# Patient Record
Sex: Female | Born: 1950 | Race: White | Hispanic: No | Marital: Single | Smoking: Never smoker
Health system: Southern US, Community
[De-identification: ages and names within clinical notes are randomized; demographics above are authoritative.]

## PROBLEM LIST (undated history)

## (undated) DIAGNOSIS — F32A Depression, unspecified: Secondary | ICD-10-CM

## (undated) DIAGNOSIS — C44529 Squamous cell carcinoma of skin of other part of trunk: Secondary | ICD-10-CM

## (undated) DIAGNOSIS — K633 Ulcer of intestine: Secondary | ICD-10-CM

## (undated) DIAGNOSIS — G8929 Other chronic pain: Secondary | ICD-10-CM

## (undated) DIAGNOSIS — M1990 Unspecified osteoarthritis, unspecified site: Secondary | ICD-10-CM

## (undated) DIAGNOSIS — F329 Major depressive disorder, single episode, unspecified: Secondary | ICD-10-CM

## (undated) DIAGNOSIS — D0461 Carcinoma in situ of skin of right upper limb, including shoulder: Secondary | ICD-10-CM

## (undated) DIAGNOSIS — K921 Melena: Secondary | ICD-10-CM

## (undated) HISTORY — DX: Melena: K92.1

## (undated) HISTORY — DX: Depression, unspecified: F32.A

## (undated) HISTORY — DX: Squamous cell carcinoma of skin of other part of trunk: C44.529

## (undated) HISTORY — DX: Other chronic pain: G89.29

## (undated) HISTORY — PX: BACK SURGERY: SHX140

## (undated) HISTORY — PX: HAND SURGERY: SHX662

## (undated) HISTORY — PX: EYE SURGERY: SHX253

## (undated) HISTORY — PX: SPINAL FUSION: SHX223

## (undated) HISTORY — PX: JOINT REPLACEMENT: SHX530

## (undated) HISTORY — DX: Ulcer of intestine: K63.3

## (undated) HISTORY — PX: BREAST CYST ASPIRATION: SHX578

## (undated) HISTORY — DX: Carcinoma in situ of skin of right upper limb, including shoulder: D04.61

## (undated) HISTORY — PX: FRACTURE SURGERY: SHX138

## (undated) HISTORY — DX: Unspecified osteoarthritis, unspecified site: M19.90

---
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
## 2015-01-14 LAB — HM COLONOSCOPY

## 2018-01-23 ENCOUNTER — Encounter: Payer: Self-pay | Admitting: Family Medicine

## 2019-07-09 ENCOUNTER — Encounter: Payer: Self-pay | Admitting: Primary Care

## 2019-07-09 ENCOUNTER — Ambulatory Visit (INDEPENDENT_AMBULATORY_CARE_PROVIDER_SITE_OTHER): Payer: Medicare Other | Admitting: Primary Care

## 2019-07-09 ENCOUNTER — Other Ambulatory Visit: Payer: Self-pay

## 2019-07-09 VITALS — BP 140/84 | HR 66 | Temp 96.9°F | Ht 64.0 in | Wt 130.0 lb

## 2019-07-09 DIAGNOSIS — M549 Dorsalgia, unspecified: Secondary | ICD-10-CM | POA: Insufficient documentation

## 2019-07-09 DIAGNOSIS — M545 Low back pain: Secondary | ICD-10-CM

## 2019-07-09 DIAGNOSIS — G8929 Other chronic pain: Secondary | ICD-10-CM | POA: Insufficient documentation

## 2019-07-09 DIAGNOSIS — K625 Hemorrhage of anus and rectum: Secondary | ICD-10-CM | POA: Insufficient documentation

## 2019-07-09 DIAGNOSIS — F4323 Adjustment disorder with mixed anxiety and depressed mood: Secondary | ICD-10-CM | POA: Insufficient documentation

## 2019-07-09 DIAGNOSIS — G479 Sleep disorder, unspecified: Secondary | ICD-10-CM | POA: Diagnosis not present

## 2019-07-09 DIAGNOSIS — Z1231 Encounter for screening mammogram for malignant neoplasm of breast: Secondary | ICD-10-CM

## 2019-07-09 HISTORY — DX: Hemorrhage of anus and rectum: K62.5

## 2019-07-09 LAB — COMPREHENSIVE METABOLIC PANEL
ALT: 15 U/L (ref 0–35)
AST: 19 U/L (ref 0–37)
Albumin: 4.7 g/dL (ref 3.5–5.2)
Alkaline Phosphatase: 82 U/L (ref 39–117)
BUN: 9 mg/dL (ref 6–23)
CO2: 31 mEq/L (ref 19–32)
Calcium: 10.4 mg/dL (ref 8.4–10.5)
Chloride: 100 mEq/L (ref 96–112)
Creatinine, Ser: 0.77 mg/dL (ref 0.40–1.20)
GFR: 74.12 mL/min (ref >60.00)
Glucose, Bld: 102 mg/dL — ABNORMAL HIGH (ref 70–99)
Potassium: 4.7 mEq/L (ref 3.5–5.1)
Sodium: 136 mEq/L (ref 135–145)
Total Bilirubin: 0.5 mg/dL (ref 0.2–1.2)
Total Protein: 6.9 g/dL (ref 6.0–8.3)

## 2019-07-09 LAB — CBC
HCT: 39.2 % (ref 36.0–46.0)
Hemoglobin: 13.4 g/dL (ref 12.0–15.0)
MCHC: 34.1 g/dL (ref 30.0–36.0)
MCV: 92.4 fl (ref 78.0–100.0)
Platelets: 292 10*3/uL (ref 150.0–400.0)
RBC: 4.25 Mil/uL (ref 3.87–5.11)
RDW: 15 % (ref 11.5–15.5)
WBC: 5.1 10*3/uL (ref 4.0–10.5)

## 2019-07-09 MED ORDER — MIRTAZAPINE 7.5 MG PO TABS: 7.5000 mg | ORAL_TABLET | Freq: Every day | ORAL | 0 refills | Status: DC

## 2019-07-09 NOTE — Assessment & Plan Note (Signed)
Following with Neurology in [facility] who manages Lithium, also following with therapy in [place].  Agree to add in low dose Remeron for sleep as she did well in the past.

## 2019-07-09 NOTE — Patient Instructions (Addendum)
Stop by the lab prior to leaving today. I will notify you of your results once received.   Call the [facility] to schedule your mammogram.   You will be contacted regarding your referral to GI for the colonoscopy.  Please let us know if you have not been contacted within two weeks.   Start mirtazapine (Remeron) 7.5 mg at bedtime for sleep. You can take 1-2 capsules.   It was a pleasure to meet you today! Please don't hesitate to call or message me with any questions. Welcome to [facility]!

## 2019-07-09 NOTE — Assessment & Plan Note (Signed)
Acute for the last three weeks, etiology seems to be lower GI tract. History of hemorrhoid.  Referral placed to GI for evaluation.

## 2019-07-09 NOTE — Assessment & Plan Note (Signed)
History of three back surgeries, doing better on gabapentin 300 mg TID. Continue same.

## 2019-07-09 NOTE — Assessment & Plan Note (Signed)
Situational as she is going through a divorce. Agree to prescribe Remeron as she did well on this in the past. Following with Neurology through [facility] who manages her Lithium.

## 2019-07-09 NOTE — Progress Notes (Signed)
Subjective:    Patient ID: Bonnie Ramsey, female    DOB: [birth year], 69 y.o.   MRN: [id number]  HPI  This visit occurred during the SARS-CoV-2 public health emergency.  Safety protocols were in place, including screening questions prior to the visit, additional usage of staff PPE, and extensive cleaning of exam room while observing appropriate contact time as indicated for disinfecting solutions.   [name] is a 69 year old female who presents today to establish care and discuss the problems mentioned below. Will obtain/review records. She follows through the [place] for her annual physical. She is needing a mammogram.   1) Anxiety/Depression: Currently managed on Lithium 300 mg. Following with Dr. Tamala Julian, neurology [facility]) who is prescribing Litium. She is going through a divorce now which has been challenging.   She was once managed on Remeron HS for sleep, did very well on this medication and would like to resume. She took Remeron along with Lithium in the past. She has difficulty staying asleep, will wake up with mind racing thoughts.   Currently following with therapist in [place].   2) Blood in Stool: She initially noticed this three weeks ago, bright red bleeding and mucous that occurred with bowel movements and then in between movements. She bleed for a total of six days, had bleeding in her underwear. Since then she's noticed a gradual improvement in bleeding frequency and heaviness. She has been consuming prune juice which has helped with constipation.   History of precancerous polyps, last colonoscopy was in 2016 and was told to return for recall in 2026. She believes she has an internal hemorrhoid. She is under a lot of stress with her current divorce.   3) Chronic Back Pain: History of three back surgeries beginning in 2015. Currently managed on gabapentin 300 mg TID, managed through the [place] in [place], [facility]     BP Readings from Last 3  Encounters:  07/09/19 140/84     Review of Systems  Eyes: Negative for visual disturbance.  Respiratory: Negative for shortness of breath.   Cardiovascular: Negative for chest pain.  Gastrointestinal: Positive for blood in stool and constipation. Negative for abdominal pain.  Musculoskeletal: Positive for back pain.  Neurological: Negative for dizziness and headaches.  Psychiatric/Behavioral: Positive for sleep disturbance. The patient is nervous/anxious.        See HPI       Past Medical History:  Diagnosis Date  . Blood in stool   . Chronic back pain   . Depression      Social History   Socioeconomic History  . Marital status: Legally Separated    Spouse name: Not on file  . Number of children: Not on file  . Years of education: Not on file  . Highest education level: Not on file  Occupational History  . Not on file  Tobacco Use  . Smoking status: Never Smoker  . Smokeless tobacco: Never Used  Substance and Sexual Activity  . Alcohol use: Yes  . Drug use: Not on file  . Sexual activity: Not on file  Other Topics Concern  . Not on file  Social History Narrative  . Not on file   Social Determinants of Health   Financial Resource Strain:   . Difficulty of Paying Living Expenses:   Food Insecurity:   . Worried About Charity fundraiser in the Last Year:   . Stewart in the Last Year:  Transportation Needs:   . Film/video editor (Medical):   Marland Kitchen Lack of Transportation (Non-Medical):   Physical Activity:   . Days of Exercise per Week:   . Minutes of Exercise per Session:   Stress:   . Feeling of Stress :   Social Connections:   . Frequency of Communication with Friends and Family:   . Frequency of Social Gatherings with Friends and Family:   . Attends Religious Services:   . Active Member of Clubs or Organizations:   . Attends Archivist Meetings:   Marland Kitchen Marital Status:   Intimate Partner Violence:   . Fear of Current or Ex-Partner:     . Emotionally Abused:   Marland Kitchen Physically Abused:   . Sexually Abused:      No family history on file.  No Known Allergies  Current Outpatient Medications on File Prior to Visit  Medication Sig Dispense Refill  . b complex vitamins tablet Take 1 tablet by mouth daily.    Marland Kitchen gabapentin (NEURONTIN) 300 MG capsule Take 300 mg by mouth 3 (three) times daily.    Marland Kitchen lithium 300 MG tablet Take 300 mg by mouth at bedtime.    Marland Kitchen VITAMIN D PO Take by mouth.     No current facility-administered medications on file prior to visit.    BP 140/84   Pulse 66   Temp (!) 96.9 F (36.1 C) (Temporal)   Ht 5\' 4"  (1.626 m)   Wt 130 lb (59 kg)   SpO2 98%   BMI 22.31 kg/m    Objective:   Physical Exam  Constitutional: She appears well-nourished.  Cardiovascular: Normal rate and regular rhythm.  Respiratory: Effort normal and breath sounds normal.  Musculoskeletal:     Cervical back: Neck supple.  Skin: Skin is warm and dry.  Psychiatric:  Appears anxious           Assessment & Plan:

## 2019-07-10 ENCOUNTER — Encounter: Payer: Self-pay | Admitting: Primary Care

## 2019-07-14 ENCOUNTER — Encounter: Payer: Self-pay | Admitting: Primary Care

## 2019-07-14 ENCOUNTER — Encounter: Payer: Self-pay | Admitting: Gastroenterology

## 2019-07-14 ENCOUNTER — Ambulatory Visit (INDEPENDENT_AMBULATORY_CARE_PROVIDER_SITE_OTHER): Payer: Medicare Other | Admitting: Gastroenterology

## 2019-07-14 DIAGNOSIS — K625 Hemorrhage of anus and rectum: Secondary | ICD-10-CM | POA: Diagnosis not present

## 2019-07-14 MED ORDER — HYDROCORTISONE (PERIANAL) 2.5 % EX CREA: 1.0000 "application " | TOPICAL_CREAM | Freq: Two times a day (BID) | CUTANEOUS | 0 refills | Status: AC

## 2019-07-14 NOTE — Progress Notes (Signed)
Bonnie Ramsey [address]  [place]  [place], [place] [address]  Main: [phone]  Fax: [phone]   Gastroenterology Consultation  Referring Provider:     Pleas Koch, NP Primary Care Physician:  Pleas Koch, NP Reason for Consultation:     Blood per rectum        HPI:   Virtual Visit via Video Note  I connected with patient on 07/14/19 at  1:30 PM EDT by video ([email or web address]) and verified that I am speaking with the correct person using two identifiers.   I discussed the limitations, risks, security and privacy concerns of performing an evaluation and management service by video and the availability of in person appointments. I also discussed with the patient that there may be a patient responsible charge related to this service. The patient expressed understanding and agreed to proceed.  Location of the patient: Home Location of provider: Home Participating persons: Patient and provider only (Nursing staff checked in patient via phone but were not physically involved in the video interaction - see their notes)   History of Present Illness: Chief Complaint  Patient presents with  . New Patient (Initial Visit)    rectal bleeding    Bonnie Ramsey is a 69 y.o. y/o female referred for consultation & management  by Dr. Carlis Favata, Leticia Penna, NP.  Patient reports seeing bright red blood per rectum about 6 weeks ago.  She states it was a lot and look like a..  She has since had blood work done, which was done only 5 days ago and did not show any anemia.  She does report history of a large hemorrhoid and states at one point she was told that it may need to be removed.  Reports history of a colonoscopy in 2005 or 2006 and precancerous polyp was removed.  This was done out of town and we do not have the records.  No immediate family members with colon cancer.  Since this episode reports having mucus with bowel movements and intermittently having small blood streaks  in the stool with that.  However, the amount of blood that she saw 6 weeks ago has not reoccurred.  No nausea or vomiting or dysphagia.  No weight loss.  Reports having soft bowel movement every day.  Has not tried topical treatments for hemorrhoids  Past Medical History:  Diagnosis Date  . Blood in stool   . Chronic back pain   . Depression     Past Surgical History:  Procedure Laterality Date  . SPINAL FUSION  2015, 2016, 2017   Lumbar    Prior to Admission medications   Medication Sig Start Date End Date Taking? Authorizing Provider  b complex vitamins tablet Take 1 tablet by mouth daily.   Yes [provider]  gabapentin (NEURONTIN) 300 MG capsule Take 300 mg by mouth 3 (three) times daily.   Yes [provider]  lithium 300 MG tablet Take 300 mg by mouth at bedtime.   Yes [provider]  mirtazapine (REMERON) 7.5 MG tablet Take 1-2 tablets (7.5-15 mg total) by mouth at bedtime. As needed for sleep. 07/09/19  Yes Pleas Koch, NP  VITAMIN D PO Take by mouth.   Yes [provider]  hydrocortisone (ANUSOL-HC) 2.5 % rectal cream Place 1 application rectally 2 (two) times daily for 10 days. 07/14/19 07/24/19  Virgel Manifold, MD    History reviewed. No pertinent family history.   Social History  Tobacco Use  . Smoking status: Never Smoker  . Smokeless tobacco: Never Used  Substance Use Topics  . Alcohol use: Yes  . Drug use: Not on file    Allergies as of 07/14/2019  . (No Known Allergies)    Review of Systems:    All systems reviewed and negative except where noted in HPI.   Observations/Objective:  Labs: CBC    Component Value Date/Time   WBC 5.1 07/09/2019 0942   RBC 4.25 07/09/2019 0942   HGB 13.4 07/09/2019 0942   HCT 39.2 07/09/2019 0942   PLT 292.0 07/09/2019 0942   MCV 92.4 07/09/2019 0942   MCHC 34.1 07/09/2019 0942   RDW 15.0 07/09/2019 0942   CMP     Component Value Date/Time   NA 136 07/09/2019  0942   K 4.7 07/09/2019 0942   CL 100 07/09/2019 0942   CO2 31 07/09/2019 0942   GLUCOSE 102 (H) 07/09/2019 0942   BUN 9 07/09/2019 0942   CREATININE 0.77 07/09/2019 0942   CALCIUM 10.4 07/09/2019 0942   PROT 6.9 07/09/2019 0942   ALBUMIN 4.7 07/09/2019 0942   AST 19 07/09/2019 0942   ALT 15 07/09/2019 0942   ALKPHOS 82 07/09/2019 0942   BILITOT 0.5 07/09/2019 0942    Imaging Studies: No results found.  Assessment and Plan:   Bonnie Ramsey is a 69 y.o. y/o female has been referred for blood per rectum  Assessment and Plan: Clinical history and labs consistent with likely hemorrhoids causing blood per rectum  We will prescribe topical treatment for hemorrhoids and schedule for colonoscopy to rule out any underlying lesions and malignancy  Evaluate hemorrhoids at the time of the colonoscopy to see if she would benefit from surgical referral or banding  Continue high-fiber diet  If symptoms do not improve with topical treatment or if the amount of blood per rectum increases, or she has similar episode to what she had 6 weeks ago she was asked to notify us immediately and she verbalized understanding  Follow Up Instructions:   I discussed the assessment and treatment plan with the patient. The patient was provided an opportunity to ask questions and all were answered. The patient agreed with the plan and demonstrated an understanding of the instructions.   The patient was advised to call back or seek an in-person evaluation if the symptoms worsen or if the condition fails to improve as anticipated.  I provided 15 minutes of face-to-face time via video software during this encounter.  Additional time was spent in reviewing patient's chart, placing orders etc.   Virgel Manifold, MD  Speech recognition software was used to dictate the above note.

## 2019-07-14 NOTE — Patient Instructions (Signed)

## 2019-07-20 ENCOUNTER — Other Ambulatory Visit: Payer: Self-pay

## 2019-07-20 DIAGNOSIS — K625 Hemorrhage of anus and rectum: Secondary | ICD-10-CM

## 2019-07-20 MED ORDER — NA SULFATE-K SULFATE-MG SULF 17.5-3.13-1.6 GM/177ML PO SOLN: 354.0000 mL | Freq: Once | ORAL | 0 refills | Status: AC

## 2019-08-03 ENCOUNTER — Ambulatory Visit
Admission: RE | Admit: 2019-08-03 | Discharge: 2019-08-03 | Disposition: A | Discharge status: Home / Self Care | Payer: Medicare Other | Source: Ambulatory Visit | Attending: Primary Care | Admitting: Primary Care

## 2019-08-03 DIAGNOSIS — Z1231 Encounter for screening mammogram for malignant neoplasm of breast: Secondary | ICD-10-CM | POA: Diagnosis not present

## 2019-08-20 ENCOUNTER — Telehealth: Payer: Self-pay | Admitting: Gastroenterology

## 2019-08-20 ENCOUNTER — Telehealth: Payer: Self-pay

## 2019-08-20 ENCOUNTER — Other Ambulatory Visit: Payer: Medicare Other

## 2019-08-20 NOTE — Telephone Encounter (Signed)
Bonnie Ramsey,  Just received a call from [name].  She states that patient said her colonoscopy was supposed to be on the 19th.  Because she rescheduled it, however it still indicates she is on for 08/24/19.  There were no phone notes indicating a date change.  She is currently in [place], and unable to have her COVID testing prior to her currently scheduled 08/24/19.  She would like a call back to discuss and possibly reschedule.  [phone]  Thanks,  [name], CMA

## 2019-08-20 NOTE — Telephone Encounter (Signed)
Patient is calling and has some questions and is asking if the nurse could give her a call. Please call patient and advise.

## 2019-08-21 ENCOUNTER — Other Ambulatory Visit: Payer: Self-pay

## 2019-08-21 DIAGNOSIS — K625 Hemorrhage of anus and rectum: Secondary | ICD-10-CM

## 2019-08-21 NOTE — Telephone Encounter (Signed)
Called patient to let her know that I would be able to cancel her colonoscopy and reschedule it for Sep 03, 2019. Patient agreed and had no further questions. I will mail patient new information so she could read it over again with a new prescription to make sure that she gets her prep kit. Trish from endo unit was informed.

## 2019-08-24 ENCOUNTER — Ambulatory Visit: Admission: RE | Admit: 2019-08-24 | Payer: Medicare Other | Source: Home / Self Care | Admitting: Gastroenterology

## 2019-08-24 ENCOUNTER — Encounter: Admission: RE | Payer: Self-pay | Source: Home / Self Care

## 2019-08-24 SURGERY — COLONOSCOPY WITH PROPOFOL
Anesthesia: General

## 2019-09-01 ENCOUNTER — Other Ambulatory Visit
Admission: RE | Admit: 2019-09-01 | Discharge: 2019-09-01 | Disposition: A | Discharge status: Home / Self Care | Payer: Medicare Other | Source: Ambulatory Visit | Attending: Gastroenterology | Admitting: Gastroenterology

## 2019-09-01 DIAGNOSIS — Z20822 Contact with and (suspected) exposure to covid-19: Secondary | ICD-10-CM | POA: Insufficient documentation

## 2019-09-01 DIAGNOSIS — Z01812 Encounter for preprocedural laboratory examination: Secondary | ICD-10-CM | POA: Insufficient documentation

## 2019-09-01 LAB — SARS CORONAVIRUS 2 (TAT 6-24 HRS): SARS Coronavirus 2: NEGATIVE

## 2019-09-03 ENCOUNTER — Encounter
Admission: RE | Disposition: A | Discharge status: Home / Self Care | Payer: Self-pay | Source: Home / Self Care | Attending: Gastroenterology

## 2019-09-03 ENCOUNTER — Encounter: Payer: Self-pay | Admitting: Gastroenterology

## 2019-09-03 ENCOUNTER — Ambulatory Visit: Payer: Medicare Other | Admitting: Anesthesiology

## 2019-09-03 ENCOUNTER — Ambulatory Visit
Admission: RE | Admit: 2019-09-03 | Discharge: 2019-09-03 | Disposition: A | Discharge status: Home / Self Care | Payer: Medicare Other | Attending: Gastroenterology | Admitting: Gastroenterology

## 2019-09-03 ENCOUNTER — Other Ambulatory Visit: Payer: Self-pay

## 2019-09-03 DIAGNOSIS — K633 Ulcer of intestine: Secondary | ICD-10-CM

## 2019-09-03 DIAGNOSIS — K625 Hemorrhage of anus and rectum: Secondary | ICD-10-CM | POA: Insufficient documentation

## 2019-09-03 DIAGNOSIS — K648 Other hemorrhoids: Secondary | ICD-10-CM | POA: Insufficient documentation

## 2019-09-03 DIAGNOSIS — Z1211 Encounter for screening for malignant neoplasm of colon: Secondary | ICD-10-CM | POA: Diagnosis not present

## 2019-09-03 DIAGNOSIS — Z79899 Other long term (current) drug therapy: Secondary | ICD-10-CM | POA: Insufficient documentation

## 2019-09-03 DIAGNOSIS — K5730 Diverticulosis of large intestine without perforation or abscess without bleeding: Secondary | ICD-10-CM | POA: Diagnosis not present

## 2019-09-03 DIAGNOSIS — F329 Major depressive disorder, single episode, unspecified: Secondary | ICD-10-CM | POA: Diagnosis not present

## 2019-09-03 HISTORY — PX: COLONOSCOPY WITH PROPOFOL: SHX5780

## 2019-09-03 SURGERY — COLONOSCOPY WITH PROPOFOL
Anesthesia: General

## 2019-09-03 MED ORDER — LIDOCAINE HCL (CARDIAC) PF 100 MG/5ML IV SOSY
PREFILLED_SYRINGE | INTRAVENOUS | Status: DC | PRN
  Administered 2019-09-03: 50 mg via INTRAVENOUS

## 2019-09-03 MED ORDER — SODIUM CHLORIDE 0.9 % IV SOLN: INTRAVENOUS | Status: DC

## 2019-09-03 MED ORDER — PHENYLEPHRINE HCL (PRESSORS) 10 MG/ML IV SOLN
INTRAVENOUS | Status: DC | PRN
  Administered 2019-09-03: 75 ug via INTRAVENOUS
  Administered 2019-09-03 (×2): 100 ug via INTRAVENOUS

## 2019-09-03 MED ORDER — PROPOFOL 500 MG/50ML IV EMUL
INTRAVENOUS | Status: AC
  Filled 2019-09-03: qty 50

## 2019-09-03 MED ORDER — PROPOFOL 500 MG/50ML IV EMUL
INTRAVENOUS | Status: DC | PRN
  Administered 2019-09-03: 150 ug/kg/min via INTRAVENOUS

## 2019-09-03 MED ORDER — PROPOFOL 10 MG/ML IV BOLUS
INTRAVENOUS | Status: DC | PRN
  Administered 2019-09-03: 70 mg via INTRAVENOUS

## 2019-09-03 NOTE — Op Note (Signed)
[facility] Gastroenterology Patient Name: Bonnie Ramsey Procedure Date: 09/03/2019 9:36 AM MRN: [id number] Account #: [id number] Date of Birth: 1950-08-01 Admit Type: Outpatient Age: 69 Room: [facility] ENDO ROOM 4 Gender: Female Note Status: Finalized Procedure:             Colonoscopy Indications:           Screening for colorectal malignant neoplasm Providers:             Jael Waldorf B. Bonna Gains MD, MD Medicines:             Monitored Anesthesia Care Complications:         No immediate complications. Procedure:             Pre-Anesthesia Assessment:                        - Prior to the procedure, a History and Physical was                         performed, and patient medications, allergies and                         sensitivities were reviewed. The patient's tolerance                         of previous anesthesia was reviewed.                        - The risks and benefits of the procedure and the                         sedation options and risks were discussed with the                         patient. All questions were answered and informed                         consent was obtained.                        - Patient identification and proposed procedure were                         verified prior to the procedure by the physician, the                         nurse, the anesthetist and the technician. The                         procedure was verified in the pre-procedure area in                         the procedure room in the endoscopy suite.                        - ASA Grade Assessment: II - A patient with mild                         systemic disease.                        -  After reviewing the risks and benefits, the patient                         was deemed in satisfactory condition to undergo the                         procedure.                        After obtaining informed consent, the colonoscope was                         passed under  direct vision. Throughout the procedure,                         the patient's blood pressure, pulse, and oxygen                         saturations were monitored continuously. The                         Colonoscope was introduced through the anus and                         advanced to the the cecum, identified by appendiceal                         orifice and ileocecal valve. The colonoscopy was                         performed with ease. The patient tolerated the                         procedure well. The quality of the bowel preparation                         was poor. Findings:      The perianal and digital rectal examinations were normal.      Nonbleeding ulcerated mucosa with no stigmata of recent bleeding were       present in the sigmoid colon. Biopsies were taken with a cold forceps       for histology. Biopsies were obtained in the rectum and in the sigmoid       colon with cold forceps for histology. Biopsies were taken at the ulcer       site and of the normal mucosa in the sigmoid colon and rectum as well.       The rest of the colon did not show any ulceration or inflammation and       this was the only solitary ulcer present.      Multiple diverticula were found in the sigmoid colon.      The exam was otherwise without abnormality.      Non-bleeding internal hemorrhoids were found during retroflexion. The       hemorrhoids were small. Impression:            - Preparation of the colon was poor.                        - Mucosal ulceration. Biopsied.                        -  Patient's BRBPR is from the sigmoid ulcer                        - Diverticulosis in the sigmoid colon.                        - The examination was otherwise normal.                        - Non-bleeding internal hemorrhoids.                        - Biopsies were obtained in the rectum and in the                         sigmoid colon. Recommendation:        - Discharge patient to home.                         - Resume previous diet.                        - Continue present medications.                        - Repeat colonoscopy within 3 months, wtih 2 day prep,                         because the bowel preparation was poor.                        - Return to primary care physician as previously                         scheduled.                        - The findings and recommendations were discussed with                         the patient.                        - The findings and recommendations were discussed with                         the patient's family.                        - High fiber diet.                        - High fiber diet.                        - Return to my office in 2 weeks. Procedure Code(s):     --- Professional ---                        [phone], Colonoscopy, flexible; with biopsy, single or  multiple Diagnosis Code(s):     --- Professional ---                        Z12.11, Encounter for screening for malignant neoplasm                         of colon                        K64.8, Other hemorrhoids                        K63.3, Ulcer of intestine CPT copyright 2019 American Medical Association. All rights reserved. The codes documented in this report are preliminary and upon coder review may  be revised to meet current compliance requirements.  Vonda Antigua, MD Margretta Sidle B. Bonna Gains MD, MD 09/03/2019 10:44:29 AM This report has been signed electronically. Number of Addenda: 0 Note Initiated On: 09/03/2019 9:36 AM Scope Withdrawal Time: 0 hours 15 minutes 21 seconds  Total Procedure Duration: 0 hours 26 minutes 53 seconds  Estimated Blood Loss:  Estimated blood loss: none.      [facility]

## 2019-09-03 NOTE — Anesthesia Preprocedure Evaluation (Addendum)
Anesthesia Evaluation  Patient identified by MRN, date of birth, ID band Patient awake    Reviewed: Allergy & Precautions, H&P , NPO status , reviewed documented beta blocker date and time   Airway Mallampati: II  TM Distance: >3 FB Neck ROM: full    Dental  (+) Chipped, Caps   Pulmonary    Pulmonary exam normal        Cardiovascular Normal cardiovascular exam     Neuro/Psych PSYCHIATRIC DISORDERS Depression    GI/Hepatic   Endo/Other    Renal/GU      Musculoskeletal   Abdominal   Peds  Hematology   Anesthesia Other Findings Past Medical History: No date: Blood in stool No date: Chronic back pain No date: Depression  Past Surgical History: No date: BREAST CYST ASPIRATION 2015, 2016, 2017: SPINAL FUSION     Comment:  Lumbar     Reproductive/Obstetrics                            Anesthesia Physical Anesthesia Plan  ASA: II  Anesthesia Plan: General   Post-op Pain Management:    Induction: Intravenous  PONV Risk Score and Plan: 3 and Treatment may vary due to age or medical condition and TIVA  Airway Management Planned: Nasal Cannula and Natural Airway  Additional Equipment:   Intra-op Plan:   Post-operative Plan:   Informed Consent: I have reviewed the patients History and Physical, chart, labs and discussed the procedure including the risks, benefits and alternatives for the proposed anesthesia with the patient or authorized representative who has indicated his/her understanding and acceptance.     Dental Advisory Given  Plan Discussed with: CRNA  Anesthesia Plan Comments:        Anesthesia Quick Evaluation

## 2019-09-03 NOTE — Anesthesia Postprocedure Evaluation (Signed)
Anesthesia Post Note  Patient: Bonnie Ramsey  Procedure(s) Performed: COLONOSCOPY WITH PROPOFOL (N/A )  Patient location during evaluation: Endoscopy Anesthesia Type: General Level of consciousness: awake and alert Pain management: pain level controlled Vital Signs Assessment: post-procedure vital signs reviewed and stable Respiratory status: spontaneous breathing, nonlabored ventilation and respiratory function stable Cardiovascular status: blood pressure returned to baseline and stable Postop Assessment: no apparent nausea or vomiting Anesthetic complications: no     Last Vitals:  Vitals:   09/03/19 1055 09/03/19 1105  BP: 125/81 136/76  Pulse: 81 78  Resp: 16 12  Temp:    SpO2: 98% 100%    Last Pain:  Vitals:   09/03/19 1105  TempSrc:   PainSc: 0-No pain                 Alphonsus Sias

## 2019-09-03 NOTE — H&P (Signed)
Vonda Antigua, MD [address], [place], [place], [place], [address] [address], [place], [place], [place], [address] Phone: [phone]  Fax: [phone]  Primary Care Physician:  Pleas Koch, NP   Pre-Procedure History & Physical: HPI:  Bonnie Ramsey is a 68 y.o. female is here for a colonoscopy.   Past Medical History:  Diagnosis Date  . Blood in stool   . Chronic back pain   . Depression     Past Surgical History:  Procedure Laterality Date  . BREAST CYST ASPIRATION    . SPINAL FUSION  2015, 2016, 2017   Lumbar    Prior to Admission medications   Medication Sig Start Date End Date Taking? Authorizing Provider  b complex vitamins tablet Take 1 tablet by mouth daily.    [provider]  gabapentin (NEURONTIN) 300 MG capsule Take 300 mg by mouth 3 (three) times daily.    [provider]  lithium 300 MG tablet Take 300 mg by mouth at bedtime.    [provider]  mirtazapine (REMERON) 7.5 MG tablet Take 1-2 tablets (7.5-15 mg total) by mouth at bedtime. As needed for sleep. 07/09/19   Pleas Koch, NP  VITAMIN D PO Take by mouth.    [provider]    Allergies as of 08/21/2019  . (No Known Allergies)    Family History  Problem Relation Age of Onset  . Breast cancer Mother 28    Social History   Socioeconomic History  . Marital status: Married    Spouse name: Not on file  . Number of children: Not on file  . Years of education: Not on file  . Highest education level: Not on file  Occupational History  . Not on file  Tobacco Use  . Smoking status: Never Smoker  . Smokeless tobacco: Never Used  Substance and Sexual Activity  . Alcohol use: Yes  . Drug use: Not on file  . Sexual activity: Not on file  Other Topics Concern  . Not on file  Social History Narrative  . Not on file   Social Determinants of Health   Financial Resource Strain:   . Difficulty of Paying Living Expenses:   Food  Insecurity:   . Worried About Charity fundraiser in the Last Year:   . Arboriculturist in the Last Year:   Transportation Needs:   . Film/video editor (Medical):   Marland Kitchen Lack of Transportation (Non-Medical):   Physical Activity:   . Days of Exercise per Week:   . Minutes of Exercise per Session:   Stress:   . Feeling of Stress :   Social Connections:   . Frequency of Communication with Friends and Family:   . Frequency of Social Gatherings with Friends and Family:   . Attends Religious Services:   . Active Member of Clubs or Organizations:   . Attends Archivist Meetings:   Marland Kitchen Marital Status:   Intimate Partner Violence:   . Fear of Current or Ex-Partner:   . Emotionally Abused:   Marland Kitchen Physically Abused:   . Sexually Abused:     Review of Systems: See HPI, otherwise negative ROS  Physical Exam: BP 119/79   Pulse (!) 54   Temp (!) 97.5 F (36.4 C) (Temporal)   Resp 18   Ht 5\' 5"  (1.651 m)   Wt 59 kg   SpO2 100%   BMI 21.63 kg/m  General:   Alert,  pleasant and cooperative in NAD Head:  Normocephalic and atraumatic. Neck:  Supple; no masses or thyromegaly. Lungs:  Clear throughout to auscultation, normal respiratory effort.    Heart:  +S1, +S2, Regular rate and rhythm, No edema. Abdomen:  Soft, nontender and nondistended. Normal bowel sounds, without guarding, and without rebound.   Neurologic:  Alert and  oriented x4;  grossly normal neurologically.  Impression/Plan: Bonnie Ramsey is here for a colonoscopy to be performed for average risk screening.  Risks, benefits, limitations, and alternatives regarding  colonoscopy have been reviewed with the patient.  Questions have been answered.  All parties agreeable.   Virgel Manifold, MD  09/03/2019, 9:45 AM

## 2019-09-03 NOTE — Transfer of Care (Signed)
Immediate Anesthesia Transfer of Care Note  Patient: Bonnie Ramsey  Procedure(s) Performed: COLONOSCOPY WITH PROPOFOL (N/A )  Patient Location: PACU  Anesthesia Type:General  Level of Consciousness: awake and drowsy  Airway & Oxygen Therapy: Patient Spontanous Breathing and Patient connected to nasal cannula oxygen  Post-op Assessment: Report given to RN and Post -op Vital signs reviewed and stable  Post vital signs: Reviewed and stable  Last Vitals:  Vitals Value Taken Time  BP 102/65 09/03/19 1035  Temp    Pulse 68 09/03/19 1036  Resp 25 09/03/19 1036  SpO2 98 % 09/03/19 1036  Vitals shown include unvalidated device data.  Last Pain:  Vitals:   09/03/19 0942  TempSrc: Temporal  PainSc: 0-No pain         Complications: No apparent anesthesia complications

## 2019-09-03 NOTE — Anesthesia Procedure Notes (Signed)
Date/Time: 09/03/2019 9:58 AM Performed by: Johnna Acosta, CRNA Pre-anesthesia Checklist: Emergency Drugs available, Patient identified, Suction available, Patient being monitored and Timeout performed Patient Re-evaluated:Patient Re-evaluated prior to induction Oxygen Delivery Method: Nasal cannula Preoxygenation: Pre-oxygenation with 100% oxygen Induction Type: IV induction

## 2019-09-04 ENCOUNTER — Encounter: Payer: Self-pay | Admitting: *Deleted

## 2019-09-06 ENCOUNTER — Encounter: Payer: Self-pay | Admitting: Emergency Medicine

## 2019-09-06 ENCOUNTER — Other Ambulatory Visit: Payer: Self-pay

## 2019-09-06 ENCOUNTER — Emergency Department: Payer: Medicare Other

## 2019-09-06 ENCOUNTER — Emergency Department
Admission: EM | Admit: 2019-09-06 | Discharge: 2019-09-06 | Disposition: A | Discharge status: Home / Self Care | Payer: Medicare Other | Attending: Emergency Medicine | Admitting: Emergency Medicine

## 2019-09-06 DIAGNOSIS — W16022A Fall into swimming pool striking bottom causing other injury, initial encounter: Secondary | ICD-10-CM | POA: Diagnosis not present

## 2019-09-06 DIAGNOSIS — Y9389 Activity, other specified: Secondary | ICD-10-CM | POA: Insufficient documentation

## 2019-09-06 DIAGNOSIS — S92352A Displaced fracture of fifth metatarsal bone, left foot, initial encounter for closed fracture: Secondary | ICD-10-CM | POA: Insufficient documentation

## 2019-09-06 DIAGNOSIS — Z79899 Other long term (current) drug therapy: Secondary | ICD-10-CM | POA: Insufficient documentation

## 2019-09-06 DIAGNOSIS — Y92008 Other place in unspecified non-institutional (private) residence as the place of occurrence of the external cause: Secondary | ICD-10-CM | POA: Diagnosis not present

## 2019-09-06 DIAGNOSIS — S99922A Unspecified injury of left foot, initial encounter: Secondary | ICD-10-CM | POA: Diagnosis present

## 2019-09-06 DIAGNOSIS — Y999 Unspecified external cause status: Secondary | ICD-10-CM | POA: Diagnosis not present

## 2019-09-06 NOTE — ED Provider Notes (Signed)
[facility] Emergency Department Provider Note  ____________________________________________  Time seen: Approximately 9:00 AM  I have reviewed the triage vital signs and the nursing notes.   HISTORY  Chief Complaint Foot Pain    HPI Bonnie Ramsey is a 69 y.o. female that presents to the emergency department for evaluation of left foot pain after an injury 2 days ago.  Patient was trying to help her sister put a pool cover over the pool and she ended up tripping into the water filled pool.  Her foot rolled inward and most of her weight landed on the outside of her foot.  She has pain and swelling to the outside of her foot.  It is painful to walk.  She did not hit her head or lose consciousness.  Patient is from [place].  Past Medical History:  Diagnosis Date  . Blood in stool   . Chronic back pain   . Depression     Patient Active Problem List   Diagnosis Date Noted  . Special screening for malignant neoplasms, colon   . Ulceration of intestine   . Rectal bleeding 07/09/2019  . Encounter for screening mammogram for malignant neoplasm of breast 07/09/2019  . Sleep disturbance 07/09/2019  . Chronic back pain 07/09/2019  . Adjustment reaction with anxiety and depression 07/09/2019    Past Surgical History:  Procedure Laterality Date  . BREAST CYST ASPIRATION    . COLONOSCOPY WITH PROPOFOL N/A 09/03/2019   Procedure: COLONOSCOPY WITH PROPOFOL;  Surgeon: Virgel Manifold, MD;  Location: [facility] ENDOSCOPY;  Service: Endoscopy;  Laterality: N/A;  . SPINAL FUSION  2015, 2016, 2017   Lumbar    Prior to Admission medications   Medication Sig Start Date End Date Taking? Authorizing Provider  b complex vitamins tablet Take 1 tablet by mouth daily.    [provider]  gabapentin (NEURONTIN) 300 MG capsule Take 300 mg by mouth 3 (three) times daily.    [provider]  lithium 300 MG tablet Take 300 mg by mouth at bedtime.    [provider]  mirtazapine (REMERON) 7.5 MG tablet Take 1-2 tablets (7.5-15 mg total) by mouth at bedtime. As needed for sleep. 07/09/19   Pleas Koch, NP  VITAMIN D PO Take by mouth.    [provider]    Allergies Cipro [ciprofloxacin-ciproflox hcl er] and Morphine and related  Family History  Problem Relation Age of Onset  . Breast cancer Mother 50    Social History Social History   Tobacco Use  . Smoking status: Never Smoker  . Smokeless tobacco: Never Used  Substance Use Topics  . Alcohol use: Yes  . Drug use: Not on file     Review of Systems  Respiratory: No SOB. Gastrointestinal: No nausea, no vomiting.  Musculoskeletal: Positive for foot pain. Skin: Negative for rash, abrasions, lacerations, ecchymosis. Neurological: Negative for numbness or tingling   ____________________________________________   PHYSICAL EXAM:  VITAL SIGNS: ED Triage Vitals  Enc Vitals Group     BP 09/06/19 0752 (!) 148/84     Pulse Rate 09/06/19 0752 68     Resp 09/06/19 0752 16     Temp 09/06/19 0754 97.7 F (36.5 C)     Temp Source 09/06/19 0754 Oral     SpO2 09/06/19 0752 100 %     Weight 09/06/19 0811 129 lb 15.7 oz (59 kg)     Height 09/06/19 0811 5\' 5"  (1.651 m)  Head Circumference --      Peak Flow --      Pain Score 09/06/19 0753 4     Pain Loc --      Pain Edu? --      Excl. in [place]? --      Constitutional: Alert and oriented. Well appearing and in no acute distress. Eyes: Conjunctivae are normal. PERRL. EOMI. Head: Atraumatic. ENT:      Ears:      Nose: No congestion/rhinnorhea.      Mouth/Throat: Mucous membranes are moist.  Neck: No stridor. Cardiovascular: Normal rate, regular rhythm.  Good peripheral circulation.  Symmetric pedal pulses bilaterally. Respiratory: Normal respiratory effort without tachypnea or retractions. Lungs CTAB. Good air entry to the bases with no decreased or absent breath sounds. Musculoskeletal: Full range of  motion to all extremities. No gross deformities appreciated.  Tenderness, swelling, ecchymosis to left lateral foot. Neurologic:  Normal speech and language. No gross focal neurologic deficits are appreciated.  Skin:  Skin is warm, dry and intact. No rash noted. Psychiatric: Mood and affect are normal. Speech and behavior are normal. Patient exhibits appropriate insight and judgement.   ____________________________________________   LABS (all labs ordered are listed, but only abnormal results are displayed)  Labs Reviewed - No data to display ____________________________________________  EKG   ____________________________________________  RADIOLOGY Robinette Haines, personally viewed and evaluated these images (plain radiographs) as part of my medical decision making, as well as reviewing the written report by the radiologist.  DG Foot Complete Left  Result Date: 09/06/2019 CLINICAL DATA:  Twisting injury 2 days ago with persistent pain, initial encounter EXAM: LEFT FOOT - COMPLETE 3+ VIEW COMPARISON:  None. FINDINGS: Postsurgical changes are noted in the first metatarsal. Hallux valgus deformity is noted. There is an oblique comminuted fracture of the midshaft of the fifth metatarsal identified with mild displacement at the fracture site. No other fractures are seen. Generalized soft tissue swelling is noted about the midfoot. IMPRESSION: Fifth metatarsal fracture with soft tissue swelling. Postsurgical and degenerative changes as described. Electronically Signed   By: Inez Catalina M.D.   On: 09/06/2019 08:44    ____________________________________________    PROCEDURES  Procedure(s) performed:    Procedures    Medications - No data to display   ____________________________________________   INITIAL IMPRESSION / ASSESSMENT AND PLAN / ED COURSE  Pertinent labs & imaging results that were available during my care of the patient were reviewed by me and considered in my  medical decision making (see chart for details).  Review of the [place] CSRS was performed in accordance of the [place] prior to dispensing any controlled drugs.   Patient's diagnosis is consistent with fifth metatarsal fracture.  Vital signs and exam are reassuring.  X-ray is consistent with fracture.  Walking boot was placed.  Crutches were given.  Patient is to follow up with podiatry as directed. Patient is given ED precautions to return to the ED for any worsening or new symptoms.  Ezekiel Couey Erdmann was evaluated in Emergency Department on 09/06/2019 for the symptoms described in the history of present illness. She was evaluated in the context of the global COVID-19 pandemic, which necessitated consideration that the patient might be at risk for infection with the SARS-CoV-2 virus that causes COVID-19. Institutional protocols and algorithms that pertain to the evaluation of patients at risk for COVID-19 are in a state of rapid change based on information released by regulatory bodies including the CDC and federal and  state organizations. These policies and algorithms were followed during the patient's care in the ED.   ____________________________________________  FINAL CLINICAL IMPRESSION(S) / ED DIAGNOSES  Final diagnoses:  Displaced fracture of fifth metatarsal bone, left foot, initial encounter for closed fracture      NEW MEDICATIONS STARTED DURING THIS VISIT:  ED Discharge Orders    None          This chart was dictated using voice recognition software/Dragon. Despite best efforts to proofread, errors can occur which can change the meaning. Any change was purely unintentional.    Laban Emperor, PA-C 09/06/19 1057    Harvest Dark, MD 09/06/19 [phone]

## 2019-09-06 NOTE — ED Triage Notes (Signed)
Pt to ED via POV c/o left foot pain and swelling s/p injury on Friday. Pt states that she was putting a pool cover on and rolled her foot. Pt is able to walk on her foot but it is painful. Pt is in NAD.

## 2019-09-06 NOTE — Discharge Instructions (Addendum)
You have a fracture in your foot.  Please ice and elevate foot tonight.  Please use crutches and do not wear weight to left foot.  Please call podiatry tomorrow morning for a follow-up appointment this week.

## 2019-09-07 LAB — SURGICAL PATHOLOGY

## 2019-09-08 ENCOUNTER — Other Ambulatory Visit: Payer: Self-pay | Admitting: Gastroenterology

## 2019-09-08 ENCOUNTER — Telehealth: Payer: Self-pay

## 2019-09-08 MED ORDER — LINACLOTIDE 145 MCG PO CAPS: 145.0000 ug | ORAL_CAPSULE | Freq: Every day | ORAL | 1 refills | Status: DC

## 2019-09-08 NOTE — Telephone Encounter (Signed)
-----   Message from Virgel Manifold, MD sent at 09/08/2019 10:52 AM EDT ----- Herb Grays please let the patient know, her biopsies were benign.  The ulcer in her sigmoid colon was likely due to her chronic constipation.  I have sent Linzess over to her pharmacy.  Please schedule follow-up with me in 4 weeks

## 2019-09-08 NOTE — Telephone Encounter (Signed)
Pt notified of results via mychart. Also, left a voicemail to return call to schedule follow up appt.

## 2019-09-08 NOTE — Telephone Encounter (Signed)
Patient called back and I was able to let her know that her biopsies were benign and that Dr. Bonna Gains had sent her a prescription-Linzess to her pharmacy to help with her colonoscopy. Patient stated that she would pick up her prescription but that she was moving to [place] and that she would get her a new gastroenterologist over there. I wished her well and to call us if she needed anything.

## 2019-09-08 NOTE — Telephone Encounter (Signed)
-----   Message from Virgel Manifold, MD sent at 09/08/2019 10:52 AM EDT ----- Bonnie Ramsey please let the patient know, her biopsies were benign.  The ulcer in her sigmoid colon was likely due to her chronic constipation.  I have sent Linzess over to her pharmacy.  Please schedule follow-up with me in 4 weeks

## 2020-01-27 NOTE — Telephone Encounter (Signed)
Patient called.  Patient said she's moving to [place] and will no longer be seeing [name].  Patient said she received her flu shot last week. I'll take patient's name out as PCP.

## 2020-05-30 ENCOUNTER — Ambulatory Visit (INDEPENDENT_AMBULATORY_CARE_PROVIDER_SITE_OTHER): Payer: Medicare Other | Admitting: Family Medicine

## 2020-05-30 ENCOUNTER — Encounter: Payer: Self-pay | Admitting: Family Medicine

## 2020-05-30 ENCOUNTER — Other Ambulatory Visit: Payer: Self-pay

## 2020-05-30 VITALS — BP 130/90 | HR 57 | Temp 97.4°F | Ht 64.0 in | Wt 133.2 lb

## 2020-05-30 DIAGNOSIS — C4490 Unspecified malignant neoplasm of skin, unspecified: Secondary | ICD-10-CM | POA: Diagnosis not present

## 2020-05-30 DIAGNOSIS — Z96652 Presence of left artificial knee joint: Secondary | ICD-10-CM

## 2020-05-30 DIAGNOSIS — M25562 Pain in left knee: Secondary | ICD-10-CM

## 2020-05-30 DIAGNOSIS — G8929 Other chronic pain: Secondary | ICD-10-CM

## 2020-05-30 DIAGNOSIS — M25362 Other instability, left knee: Secondary | ICD-10-CM

## 2020-05-30 NOTE — Progress Notes (Signed)
Bonnie Tessler T. Dallys Nowakowski, MD, [place] at [facility] [place], [address]  Phone: [phone]  FAX: [phone]  Bonnie Ramsey - 70 y.o. female  MRN [id number]  Date of Birth: Jun 11, 1950  Date: 05/30/2020  PCP: Pleas Koch, NP  Referral: No ref. provider found  Chief Complaint  Patient presents with  . Referral    Orthopedic for knee revision replacement    This visit occurred during the SARS-CoV-2 public health emergency.  Safety protocols were in place, including screening questions prior to the visit, additional usage of staff PPE, and extensive cleaning of exam room while observing appropriate contact time as indicated for disinfecting solutions.   Subjective:   Bonnie Ramsey is a 70 y.o. very pleasant female patient with Body mass index is 22.87 kg/m. who presents with the following:  [employer], [place], and [place].    Left knee replacement 2005.  She had to have a revision surgery in 2013.  Previous operative notes are included.  She very recently saw an orthopedic surgeon in [place], but they recommended total knee revision again.  She has quite significant hyperextension and mechanical buckling at times.  2005, initial knee replacement 2013, revision. Spur has grown to the prosthesis. They felt that this was fixed 2019, started to hyperextend.  Prior knee replacement left  Review of Systems is noted in the HPI, as appropriate   Objective:   BP 130/90   Pulse (!) 57   Temp (!) 97.4 F (36.3 C) (Temporal)   Ht 5\' 4"  (1.626 m)   Wt 133 lb 4 oz (60.4 kg)   SpO2 97%   BMI 22.87 kg/m   GEN: No acute distress; alert,appropriate. PULM: Breathing comfortably in no respiratory distress PSYCH: Normally interactive.    On basic exam, the patient hyperextended her knee kind of a lot at least 15 to 20 degrees.  At the time of the movement I felt some mechanical  shifting, so I stopped her exam.  He did appear to be stable to varus and valgus stress.  She also has a area on her arm that has been growing for about the last 6 months.  Radiology: No results found.  Assessment and Plan:     ICD-10-CM   1. Chronic knee pain after total replacement of left knee joint  M25.562 Ambulatory referral to Orthopedic Surgery   G89.29    Z96.652   2. History of total left knee replacement  Z96.652 Ambulatory referral to Orthopedic Surgery  3. Knee instability, left  M25.362 Ambulatory referral to Orthopedic Surgery  4. Skin cancer  C44.90 Ambulatory referral to Dermatology   Knee instability and pain after total joint replacement and revision x1.  I think that she needs to be seen by one of the orthopedic surgeons who has expertise in additional training in total joint replacement and revision.  Consultation is placed.  I worried that she has skin cancer, so of asked dermatology to see her more quickly.  Her total joint complications significantly impact her quality of life daily as well as her ability to exercise.   Medications Discontinued During This Encounter  Medication Reason  . linaclotide (LINZESS) 145 MCG CAPS capsule Completed Course  . lithium 300 MG tablet Completed Course  . mirtazapine (REMERON) 7.5 MG tablet Completed Course  . VITAMIN D PO Completed Course   Orders Placed This Encounter  Procedures  . Ambulatory  referral to Orthopedic Surgery  . Ambulatory referral to Dermatology    Follow-up: No follow-ups on file.  Signed,  Maud Deed. Blakely Gluth, MD   Outpatient Encounter Medications as of 05/30/2020  Medication Sig  . b complex vitamins tablet Take 1 tablet by mouth daily.  Marland Kitchen gabapentin (NEURONTIN) 300 MG capsule Take 300 mg by mouth 3 (three) times daily.  . [DISCONTINUED] linaclotide (LINZESS) 145 MCG CAPS capsule Take 1 capsule (145 mcg total) by mouth daily before breakfast.  . [DISCONTINUED] lithium 300 MG tablet Take 300  mg by mouth at bedtime.  . [DISCONTINUED] mirtazapine (REMERON) 7.5 MG tablet Take 1-2 tablets (7.5-15 mg total) by mouth at bedtime. As needed for sleep.  . [DISCONTINUED] VITAMIN D PO Take by mouth.   No facility-administered encounter medications on file as of 05/30/2020.

## 2020-07-15 ENCOUNTER — Other Ambulatory Visit: Payer: Self-pay

## 2020-07-15 ENCOUNTER — Ambulatory Visit (INDEPENDENT_AMBULATORY_CARE_PROVIDER_SITE_OTHER): Payer: Medicare Other

## 2020-07-15 DIAGNOSIS — Z0000 Encounter for general adult medical examination without abnormal findings: Secondary | ICD-10-CM | POA: Diagnosis not present

## 2020-07-15 NOTE — Progress Notes (Signed)
PCP notes:  Health Maintenance: Dexa- due    Abnormal Screenings: PHQ9- 5   Patient concerns: Discuss starting Low dose lithium and remeron    Nurse concerns: none   Next PCP appt.: 07/27/2020 @ 9:20 am

## 2020-07-15 NOTE — Progress Notes (Signed)
Subjective:   Bonnie Ramsey is a 70 y.o. female who presents for Medicare Annual (Subsequent) preventive examination.  Review of Systems: N/A      I connected with the patient today by telephone and verified that I am speaking with the correct person using two identifiers. Location patient: home Location nurse: work Persons participating in the telephone visit: patient, nurse.   I discussed the limitations, risks, security and privacy concerns of performing an evaluation and management service by telephone and the availability of in person appointments. I also discussed with the patient that there may be a patient responsible charge related to this service. The patient expressed understanding and verbally consented to this telephonic visit.        Cardiac Risk Factors include: advanced age (>14men, >54 women)     Objective:    Today's Vitals   There is no height or weight on file to calculate BMI.  Advanced Directives 07/15/2020 09/06/2019 09/03/2019  Does Patient Have a Medical Advance Directive? Yes Yes Yes  Type of Paramedic of [place];Living will Living will Living will  Does patient want to make changes to medical advance directive? - No - Patient declined -  Copy of [place] in Chart? No - copy requested - -    Current Medications (verified) Outpatient Encounter Medications as of 07/15/2020  Medication Sig  . b complex vitamins tablet Take 1 tablet by mouth daily.  Marland Kitchen gabapentin (NEURONTIN) 300 MG capsule Take 300 mg by mouth 3 (three) times daily.  . Omega 3-6-9 Fatty Acids (OMEGA 3-6-9 COMPLEX PO) Take by mouth.   No facility-administered encounter medications on file as of 07/15/2020.    Allergies (verified) Cipro [ciprofloxacin-ciproflox hcl er] and Morphine and related   History: Past Medical History:  Diagnosis Date  . Blood in stool   . Chronic back pain   . Depression   . 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 BREAST CYST ASPIRATION    . COLONOSCOPY WITH PROPOFOL N/A 09/03/2019   Procedure: COLONOSCOPY WITH PROPOFOL;  Surgeon: Virgel Manifold, MD;  Location: [facility] ENDOSCOPY;  Service: Endoscopy;  Laterality: N/A;  . SPINAL FUSION  2015, 2016, 2017   Lumbar   Family History  Problem Relation Age of Onset  . Breast cancer Mother 41   Social History   Socioeconomic History  . Marital status: Single    Spouse name: Not on file  . Number of children: Not on file  . Years of education: Not on file  . Highest education level: Not on file  Occupational History  . Not on file  Tobacco Use  . Smoking status: Never Smoker  . Smokeless tobacco: Never Used  Substance and Sexual Activity  . Alcohol use: Yes  . Drug use: Not on file  . Sexual activity: Not on file  Other Topics Concern  . Not on file  Social History Narrative  . Not on file   Social Determinants of Health   Financial Resource Strain: Low Risk   . Difficulty of Paying Living Expenses: Not hard at all  Food Insecurity: No Food Insecurity  . Worried About Charity fundraiser in the Last Year: Never true  . Ran Out of Food in  the Last Year: Never true  Transportation Needs: No Transportation Needs  . Lack of Transportation (Medical): No  . Lack of Transportation (Non-Medical): No  Physical Activity: Sufficiently Active  . Days of Exercise per Week: 3 days  . Minutes of Exercise per Session: 60 min  Stress: Stress Concern Present  . Feeling of Stress : To some extent  Social Connections: Not on file    Tobacco Counseling Counseling given: Not Answered   Clinical Intake:  Pre-visit preparation completed: Yes  Pain : No/denies pain     Nutritional Risks: None Diabetes: No  How often do you need to have someone help you when  you read instructions, pamphlets, or other written materials from your doctor or pharmacy?: 1 - Never  Diabetic: No Nutrition Risk Assessment:  Has the patient had any N/V/D within the last 2 months?  No  Does the patient have any non-healing wounds?  No  Has the patient had any unintentional weight loss or weight gain?  No   Diabetes:  Is the patient diabetic?  No  If diabetic, was a CBG obtained today?  N/A Did the patient bring in their glucometer from home?  N/A How often do you monitor your CBG's? N/A.   Financial Strains and Diabetes Management:  Are you having any financial strains with the device, your supplies or your medication? N/A.  Does the patient want to be seen by Chronic Care Management for management of their diabetes?  N/A Would the patient like to be referred to a Nutritionist or for Diabetic Management?  N/A   Interpreter Needed?: No  Information entered by :: CJohnson, LPN   Activities of Daily Living In your present state of health, do you have any difficulty performing the following activities: 07/15/2020  Hearing? N  Vision? N  Difficulty concentrating or making decisions? N  Walking or climbing stairs? N  Dressing or bathing? N  Doing errands, shopping? N  Preparing Food and eating ? N  Using the Toilet? N  In the past six months, have you accidently leaked urine? N  Do you have problems with loss of bowel control? N  Managing your Medications? N  Managing your Finances? N  Housekeeping or managing your Housekeeping? N  Some recent data might be hidden    Patient Care Team: Pleas Koch, NP as PCP - General (Internal Medicine) Dasher, Rayvon Char, MD (Dermatology)  Indicate any recent Medical Services you may have received from other than [facility] providers in the past year (date may be approximate).     Assessment:   This is a routine wellness examination for Bonnie Ramsey.  Hearing/Vision screen  Hearing Screening   125Hz  250Hz  500Hz  1000Hz   2000Hz  3000Hz  4000Hz  6000Hz  8000Hz   Right ear:           Left ear:           Vision Screening Comments: Patient gets annual eye exams   Dietary issues and exercise activities discussed: Current Exercise Habits: Structured exercise class, Type of exercise: yoga;walking, Time (Minutes): 60, Frequency (Times/Week): 3, Weekly Exercise (Minutes/Week): 180, Intensity: Moderate, Exercise limited by: None identified  Goals    . Patient Stated     07/15/2020, I will continue to do yoga 3 days a week for 1 hour.      Depression Screen PHQ 2/9 Scores 07/15/2020  PHQ - 2 Score 4  PHQ- 9 Score 5    Fall Risk Fall Risk  07/15/2020  Falls in the past year?  0  Number falls in past yr: 0  Injury with Fall? 0  Risk for fall due to : No Fall Risks  Follow up Falls evaluation completed;Falls prevention discussed    FALL RISK PREVENTION PERTAINING TO THE HOME:  Any stairs in or around the home? Yes  If so, are there any without handrails? No  Home free of loose throw rugs in walkways, pet beds, electrical cords, etc? Yes  Adequate lighting in your home to reduce risk of falls? Yes   ASSISTIVE DEVICES UTILIZED TO PREVENT FALLS:  Life alert? No  Use of a cane, walker or w/c? No  Grab bars in the bathroom? No  Shower chair or bench in shower? No  Elevated toilet seat or a handicapped toilet? No   TIMED UP AND GO:  Was the test performed? N/A telephone visit .   Cognitive Function: MMSE - Mini Mental State Exam 07/15/2020  Not completed: Unable to complete       Mini Cog  Mini-Cog screen was not completed. Did not have time. Maximum score is 22. A value of 0 denotes this part of the MMSE was not completed or the patient failed this part of the Mini-Cog screening.  Immunizations Immunization History  Administered Date(s) Administered  . Influenza, Seasonal, Injecte, Preservative Fre 01/15/2015  . Influenza,inj,Quad PF,6+ Mos 01/19/2016  . Influenza-Unspecified 02/15/2008, 01/23/2018,  01/15/2020  . Moderna Sars-Covid-2 Vaccination 05/11/2019, 06/08/2019, 04/17/2020  . Pneumococcal Conjugate-13 11/15/2014  . Pneumococcal Polysaccharide-23 09/26/2016  . Td 08/15/2007, 04/17/2015  . Tdap 04/16/2010  . Zoster 04/16/2010  . Zoster Recombinat (Shingrix) 04/16/2016, 07/15/2016    TDAP status: Up to date  Flu Vaccine status: Up to date  Pneumococcal vaccine status: Up to date  Covid-19 vaccine status: Completed vaccines  Qualifies for Shingles Vaccine? Yes   Zostavax completed Yes   Shingrix Completed?: Yes  Screening Tests Health Maintenance  Topic Date Due  . Hepatitis C Screening  Never done  . DEXA SCAN  Never done  . COVID-19 Vaccine (4 - Booster for Moderna series) 10/15/2020  . INFLUENZA VACCINE  11/14/2020  . MAMMOGRAM  08/02/2021  . TETANUS/TDAP  04/16/2025  . COLONOSCOPY (Pts 45-87yrs Insurance coverage will need to be confirmed)  09/02/2029  . PNA vac Low Risk Adult  Completed  . HPV VACCINES  Aged Out    Health Maintenance  Health Maintenance Due  Topic Date Due  . Hepatitis C Screening  Never done  . DEXA SCAN  Never done    Colorectal cancer screening: Type of screening: Colonoscopy. Completed 09/03/2019. Repeat every 10 years  Mammogram status: Completed 08/03/2019. Repeat every year  Bone Density status: due, will discuss with provider   Lung Cancer Screening: (Low Dose CT Chest recommended if Age 43-80 years, 30 pack-year currently smoking OR have quit w/in 15years.) does not qualify.    Additional Screening:  Hepatitis C Screening: does qualify; Completed due  Vision Screening: Recommended annual ophthalmology exams for early detection of glaucoma and other disorders of the eye. Is the patient up to date with their annual eye exam?  Yes  Who is the provider or what is the name of the office in which the patient attends annual eye exams? Dr. Sherlean Foot, [facility] If pt is not established with a provider, would they like to be referred to  a provider to establish care? No .   Dental Screening: Recommended annual dental exams for proper oral hygiene  Community Resource Referral / Chronic Care Management: CRR required  this visit?  No   CCM required this visit?  No      Plan:     I have personally reviewed and noted the following in the patient's chart:   . Medical and social history . Use of alcohol, tobacco or illicit drugs  . Current medications and supplements . Functional ability and status . Nutritional status . Physical activity . Advanced directives . List of other physicians . Hospitalizations, surgeries, and ER visits in previous 12 months . Vitals . Screenings to include cognitive, depression, and falls . Referrals and appointments  In addition, I have reviewed and discussed with patient certain preventive protocols, quality metrics, and best practice recommendations. A written personalized care plan for preventive services as well as general preventive health recommendations were provided to patient.   Due to this being a telephonic visit, the after visit summary with patients personalized plan was offered to patient via office or my-chart. Patient preferred to pick up at office at next visit or via mychart.   Andrez Grime, LPN   06/21/3289

## 2020-07-15 NOTE — Patient Instructions (Signed)
Bonnie Ramsey , Thank you for taking time to come for your Medicare Wellness Visit. I appreciate your ongoing commitment to your health goals. Please review the following plan we discussed and let me know if I can assist you in the future.   Screening recommendations/referrals: Colonoscopy: Up to date, completed 09/03/2019, due 08/2029 Mammogram: Up to date, completed 08/03/2019, due 07/2020 Bone Density: due, will discuss with provider Recommended yearly ophthalmology/optometry visit for glaucoma screening and checkup Recommended yearly dental visit for hygiene and checkup  Vaccinations: Influenza vaccine: Up to date, completed 01/2020, due 11/2020 Pneumococcal vaccine: Completed series Tdap vaccine: Up to date, completed 04/17/2015, due 04/2025 Shingles vaccine: Completed series   Covid-19:Completed series  Advanced directives: Please bring a copy of your POA (Power of [place]) and/or Living Will to your next appointment.   Conditions/risks identified: none  Next appointment: Follow up in one year for your annual wellness visit    Preventive Care 65 Years and Older, Female Preventive care refers to lifestyle choices and visits with your health care provider that can promote health and wellness. What does preventive care include?  A yearly physical exam. This is also called an annual well check.  Dental exams once or twice a year.  Routine eye exams. Ask your health care provider how often you should have your eyes checked.  Personal lifestyle choices, including:  Daily care of your teeth and gums.  Regular physical activity.  Eating a healthy diet.  Avoiding tobacco and drug use.  Limiting alcohol use.  Practicing safe sex.  Taking low-dose aspirin every day.  Taking vitamin and mineral supplements as recommended by your health care provider. What happens during an annual well check? The services and screenings done by your health care provider during your annual well  check will depend on your age, overall health, lifestyle risk factors, and family history of disease. Counseling  Your health care provider may ask you questions about your:  Alcohol use.  Tobacco use.  Drug use.  Emotional well-being.  Home and relationship well-being.  Sexual activity.  Eating habits.  History of falls.  Memory and ability to understand (cognition).  Work and work Statistician.  Reproductive health. Screening  You may have the following tests or measurements:  Height, weight, and BMI.  Blood pressure.  Lipid and cholesterol levels. These may be checked every 5 years, or more frequently if you are over 43 years old.  Skin check.  Lung cancer screening. You may have this screening every year starting at age 20 if you have a 30-pack-year history of smoking and currently smoke or have quit within the past 15 years.  Fecal occult blood test (FOBT) of the stool. You may have this test every year starting at age 9.  Flexible sigmoidoscopy or colonoscopy. You may have a sigmoidoscopy every 5 years or a colonoscopy every 10 years starting at age 66.  Hepatitis C blood test.  Hepatitis B blood test.  Sexually transmitted disease (STD) testing.  Diabetes screening. This is done by checking your blood sugar (glucose) after you have not eaten for a while (fasting). You may have this done every 1-3 years.  Bone density scan. This is done to screen for osteoporosis. You may have this done starting at age 24.  Mammogram. This may be done every 1-2 years. Talk to your health care provider about how often you should have regular mammograms. Talk with your health care provider about your test results, treatment options, and if necessary, the  need for more tests. Vaccines  Your health care provider may recommend certain vaccines, such as:  Influenza vaccine. This is recommended every year.  Tetanus, diphtheria, and acellular pertussis (Tdap, Td) vaccine. You  may need a Td booster every 10 years.  Zoster vaccine. You may need this after age 79.  Pneumococcal 13-valent conjugate (PCV13) vaccine. One dose is recommended after age 29.  Pneumococcal polysaccharide (PPSV23) vaccine. One dose is recommended after age 84. Talk to your health care provider about which screenings and vaccines you need and how often you need them. This information is not intended to replace advice given to you by your health care provider. Make sure you discuss any questions you have with your health care provider. Document Released: 04/29/2015 Document Revised: 12/21/2015 Document Reviewed: 02/01/2015 Elsevier Interactive Patient Education  2017 [place] Prevention in the Home Falls can cause injuries. They can happen to people of all ages. There are many things you can do to make your home safe and to help prevent falls. What can I do on the outside of my home?  Regularly fix the edges of walkways and driveways and fix any cracks.  Remove anything that might make you trip as you walk through a door, such as a raised step or threshold.  Trim any bushes or trees on the path to your home.  Use bright outdoor lighting.  Clear any walking paths of anything that might make someone trip, such as rocks or tools.  Regularly check to see if handrails are loose or broken. Make sure that both sides of any steps have handrails.  Any raised decks and porches should have guardrails on the edges.  Have any leaves, snow, or ice cleared regularly.  Use sand or salt on walking paths during winter.  Clean up any spills in your garage right away. This includes oil or grease spills. What can I do in the bathroom?  Use night lights.  Install grab bars by the toilet and in the tub and shower. Do not use towel bars as grab bars.  Use non-skid mats or decals in the tub or shower.  If you need to sit down in the shower, use a plastic, non-slip stool.  Keep the floor  dry. Clean up any water that spills on the floor as soon as it happens.  Remove soap buildup in the tub or shower regularly.  Attach bath mats securely with double-sided non-slip rug tape.  Do not have throw rugs and other things on the floor that can make you trip. What can I do in the bedroom?  Use night lights.  Make sure that you have a light by your bed that is easy to reach.  Do not use any sheets or blankets that are too big for your bed. They should not hang down onto the floor.  Have a firm chair that has side arms. You can use this for support while you get dressed.  Do not have throw rugs and other things on the floor that can make you trip. What can I do in the kitchen?  Clean up any spills right away.  Avoid walking on wet floors.  Keep items that you use a lot in easy-to-reach places.  If you need to reach something above you, use a strong step stool that has a grab bar.  Keep electrical cords out of the way.  Do not use floor polish or wax that makes floors slippery. If you must use wax,  use non-skid floor wax.  Do not have throw rugs and other things on the floor that can make you trip. What can I do with my stairs?  Do not leave any items on the stairs.  Make sure that there are handrails on both sides of the stairs and use them. Fix handrails that are broken or loose. Make sure that handrails are as long as the stairways.  Check any carpeting to make sure that it is firmly attached to the stairs. Fix any carpet that is loose or worn.  Avoid having throw rugs at the top or bottom of the stairs. If you do have throw rugs, attach them to the floor with carpet tape.  Make sure that you have a light switch at the top of the stairs and the bottom of the stairs. If you do not have them, ask someone to add them for you. What else can I do to help prevent falls?  Wear shoes that:  Do not have high heels.  Have rubber bottoms.  Are comfortable and fit you  well.  Are closed at the toe. Do not wear sandals.  If you use a stepladder:  Make sure that it is fully opened. Do not climb a closed stepladder.  Make sure that both sides of the stepladder are locked into place.  Ask someone to hold it for you, if possible.  Clearly mark and make sure that you can see:  Any grab bars or handrails.  First and last steps.  Where the edge of each step is.  Use tools that help you move around (mobility aids) if they are needed. These include:  Canes.  Walkers.  Scooters.  Crutches.  Turn on the lights when you go into a dark area. Replace any light bulbs as soon as they burn out.  Set up your furniture so you have a clear path. Avoid moving your furniture around.  If any of your floors are uneven, fix them.  If there are any pets around you, be aware of where they are.  Review your medicines with your doctor. Some medicines can make you feel dizzy. This can increase your chance of falling. Ask your doctor what other things that you can do to help prevent falls. This information is not intended to replace advice given to you by your health care provider. Make sure you discuss any questions you have with your health care provider. Document Released: 01/27/2009 Document Revised: 09/08/2015 Document Reviewed: 05/07/2014 Elsevier Interactive Patient Education  2017 [employer].

## 2020-07-21 ENCOUNTER — Encounter: Payer: Medicare Other | Admitting: Primary Care

## 2020-07-27 ENCOUNTER — Other Ambulatory Visit: Payer: Self-pay

## 2020-07-27 ENCOUNTER — Ambulatory Visit (INDEPENDENT_AMBULATORY_CARE_PROVIDER_SITE_OTHER): Payer: Medicare Other | Admitting: Primary Care

## 2020-07-27 ENCOUNTER — Encounter: Payer: Self-pay | Admitting: Primary Care

## 2020-07-27 VITALS — BP 148/88 | HR 68 | Temp 97.6°F | Ht 64.0 in | Wt 129.0 lb

## 2020-07-27 DIAGNOSIS — E2839 Other primary ovarian failure: Secondary | ICD-10-CM

## 2020-07-27 DIAGNOSIS — G479 Sleep disorder, unspecified: Secondary | ICD-10-CM | POA: Diagnosis not present

## 2020-07-27 DIAGNOSIS — F4323 Adjustment disorder with mixed anxiety and depressed mood: Secondary | ICD-10-CM

## 2020-07-27 DIAGNOSIS — Z13220 Encounter for screening for lipoid disorders: Secondary | ICD-10-CM | POA: Diagnosis not present

## 2020-07-27 DIAGNOSIS — M545 Low back pain, unspecified: Secondary | ICD-10-CM | POA: Diagnosis not present

## 2020-07-27 DIAGNOSIS — Z1231 Encounter for screening mammogram for malignant neoplasm of breast: Secondary | ICD-10-CM

## 2020-07-27 DIAGNOSIS — G8929 Other chronic pain: Secondary | ICD-10-CM

## 2020-07-27 DIAGNOSIS — K633 Ulcer of intestine: Secondary | ICD-10-CM

## 2020-07-27 LAB — COMPREHENSIVE METABOLIC PANEL
ALT: 12 U/L (ref 0–35)
AST: 19 U/L (ref 0–37)
Albumin: 4.5 g/dL (ref 3.5–5.2)
Alkaline Phosphatase: 116 U/L (ref 39–117)
BUN: 8 mg/dL (ref 6–23)
CO2: 29 mEq/L (ref 19–32)
Calcium: 10.3 mg/dL (ref 8.4–10.5)
Chloride: 97 mEq/L (ref 96–112)
Creatinine, Ser: 0.71 mg/dL (ref 0.40–1.20)
GFR: 85.79 mL/min (ref >60.00)
Glucose, Bld: 95 mg/dL (ref 70–99)
Potassium: 4.4 mEq/L (ref 3.5–5.1)
Sodium: 134 mEq/L — ABNORMAL LOW (ref 135–145)
Total Bilirubin: 0.6 mg/dL (ref 0.2–1.2)
Total Protein: 7.6 g/dL (ref 6.0–8.3)

## 2020-07-27 LAB — LIPID PANEL
Cholesterol: 252 mg/dL — ABNORMAL HIGH (ref 0–200)
HDL: 138.8 mg/dL (ref >39.00)
LDL Cholesterol: 103 mg/dL — ABNORMAL HIGH (ref 0–99)
NonHDL: 112.93
Total CHOL/HDL Ratio: 2
Triglycerides: 51 mg/dL (ref 0.0–149.0)
VLDL: 10.2 mg/dL (ref 0.0–40.0)

## 2020-07-27 MED ORDER — SERTRALINE HCL 25 MG PO TABS: 25.0000 mg | ORAL_TABLET | Freq: Every day | ORAL | 1 refills | Status: DC

## 2020-07-27 MED ORDER — MIRTAZAPINE 7.5 MG PO TABS: 7.5000 mg | ORAL_TABLET | Freq: Every day | ORAL | 0 refills | Status: DC

## 2020-07-27 MED ORDER — GABAPENTIN 300 MG PO CAPS: 300.0000 mg | ORAL_CAPSULE | Freq: Three times a day (TID) | ORAL | 3 refills | Status: DC

## 2020-07-27 NOTE — Assessment & Plan Note (Signed)
Orders placed for screening mammogram and bone density.

## 2020-07-27 NOTE — Progress Notes (Signed)
Subjective:    Patient ID: Bonnie Ramsey, female    DOB: 30-Mar-1951, 70 y.o.   MRN: [id number]  HPI  Bonnie Ramsey is a very pleasant 70 y.o. female who presents today for follow up of medical conditions and MWV Part 2 .  1) Chronic Back Pain: Currently managed on gabapentin 300 mg TID, was following through the [place] but would like to have medication managed through [place]. She does well on this regimen overall.   2) Sleep Disturbance/Anxiety/Depression: Currently managed on mirtazapine 7.5 mg nightly, ran out of this medication and is needing refills. She is currently going through a divorce from her husband which has been very difficult. She's experiencing symptoms of sleepiness nights, "hollow feeling". She is following with a therapist in [place] which has helped some, but it was recommended that she start some medication treatment.   She experienced suicidal thoughts on Cymbalta in the past. She was once on Zoloft in 2013 and did well. She would also like to resume Remeron for sleep.   Immunizations: -Tetanus: 2012 -Influenza: Completed last season  -Covid-19: Completed 3 doses -Shingles: Completed Shingrix and Zostavax -Pneumonia: Prevnar in 2016, Pneumovax 2018   Mammogram: April 2021 Dexa: No recent scan Colonoscopy: May 2021, due in June 2021 for retry due to poor prep. She has not returned, she has no plans to return.      Review of Systems  Eyes: Negative for visual disturbance.  Respiratory: Negative for shortness of breath.   Cardiovascular: Negative for chest pain.  Gastrointestinal: Negative for constipation and diarrhea.  Neurological: Negative for headaches.  Psychiatric/Behavioral:       See HPI         Past Medical History:  Diagnosis Date  . Blood in stool   . Chronic back pain   . Depression   . 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 Marital status: Married    Spouse name: Not on file  . Number of children: Not on file  . Years of education: Not on file  . Highest education level: Not on file  Occupational History  . Not on file  Tobacco Use  . Smoking status: Never Smoker  . Smokeless tobacco: Never Used  Substance and Sexual Activity  . Alcohol use: Yes  . Drug use: Not on file  . Sexual activity: Not on file  Other Topics Concern  . Not on file  Social History Narrative  . Not on file   Social Determinants of Health   Financial Resource Strain: Low Risk   . Difficulty of Paying Living Expenses: Not hard at all  Food Insecurity: No Food Insecurity  . Worried About Charity fundraiser in the Last Year: Never true  . Ran Out of Food in the Last Year: Never true  Transportation Needs: No Transportation Needs  . Lack of Transportation (Medical): No  . Lack of Transportation (Non-Medical): No  Physical Activity: Sufficiently Active  . Days of Exercise per Week: 3 days  . Minutes of Exercise per Session: 60 min  Stress: Stress Concern Present  . Feeling of Stress : To some extent  Social Connections: Not on file  Intimate Partner Violence: Not At Risk  . Fear of Current or Ex-Partner: No  .  Emotionally Abused: No  . Physically Abused: No  . Sexually Abused: No    Past Surgical History:  Procedure Laterality Date  . BREAST CYST ASPIRATION    . COLONOSCOPY WITH PROPOFOL N/A 09/03/2019   Procedure: COLONOSCOPY WITH PROPOFOL;  Surgeon: Virgel Manifold, MD;  Location: [facility] ENDOSCOPY;  Service: Endoscopy;  Laterality: N/A;  . SPINAL FUSION  2015, 2016, 2017   Lumbar    Family History  Problem Relation Age of Onset  . Breast cancer Mother 11    Allergies  Allergen Reactions  . Cipro [Ciprofloxacin-Ciproflox Hcl Er] Hives  . Morphine And Related Nausea And Vomiting    Current Outpatient Medications on File Prior to Visit   Medication Sig Dispense Refill  . b complex vitamins tablet Take 1 tablet by mouth daily.    . cholecalciferol (VITAMIN D3) 25 MCG (1000 UNIT) tablet Take 1,000 Units by mouth daily.    Marland Kitchen gabapentin (NEURONTIN) 300 MG capsule Take 300 mg by mouth 3 (three) times daily.    . Omega 3-6-9 Fatty Acids (OMEGA 3-6-9 COMPLEX PO) Take by mouth.    . mirtazapine (REMERON) 7.5 MG tablet  (Patient not taking: Reported on 07/27/2020)    . PEG 3350-KCl-NaBcb-NaCl-NaSulf (PEG 3350/ELECTROLYTES) 240 g SOLR peg 3350 240 gram-electrolytes 22.72 gram-6.72 g-5.84 g powdr for soln  TAKE AS DIRECTED ON INSTRUCTION SHEET (Patient not taking: Reported on 07/27/2020)     No current facility-administered medications on file prior to visit.    Pulse 68   Temp 97.6 F (36.4 C) (Temporal)   Ht 5\' 4"  (1.626 m)   Wt 129 lb (58.5 kg)   SpO2 97%   BMI 22.14 kg/m  Objective:   Physical Exam Cardiovascular:     Rate and Rhythm: Normal rate and regular rhythm.  Pulmonary:     Effort: Pulmonary effort is normal.     Breath sounds: Normal breath sounds.  Musculoskeletal:     Cervical back: Neck supple.  Skin:    General: Skin is warm and dry.  Psychiatric:     Comments: Tearful at times during visit           Assessment & Plan:      This visit occurred during the SARS-CoV-2 public health emergency.  Safety protocols were in place, including screening questions prior to the visit, additional usage of staff PPE, and extensive cleaning of exam room while observing appropriate contact time as indicated for disinfecting solutions.

## 2020-07-27 NOTE — Patient Instructions (Signed)
Resume mirtazapine (Remeron) 7.5 mg nightly for sleep.   Start sertraline (Zoloft) 25 mg for anxiety and depression. Take 1/2 tablet daily for about one week then increase to 1 full tablet thereafter.  Stop by the lab prior to leaving today. I will notify you of your results once received.   Please schedule a follow up visit for 6 weeks for follow up of anxiety/depression.  It was a pleasure to see you today!

## 2020-07-27 NOTE — Assessment & Plan Note (Signed)
Doing well on gabapentin 300 mg TID, [facility] but needs refills. Will provide.

## 2020-07-27 NOTE — Assessment & Plan Note (Addendum)
Active due to current separation and divorce from husband.   Discussed options for treatment, she is already in therapy so we will continue. We agreed to trial a low dose Zoloft 25 mg as it was effective years ago. Will also refill Remeron, discussed to start one medication at a time.   We will plan to follow up in 6 weeks. She will update in between now and then if anything changes.

## 2020-07-27 NOTE — Assessment & Plan Note (Signed)
Will resume Remeron 7.5 mg HS, she will also start Zoloft 25 mg first.   Follow up in 6 weeks.

## 2020-07-27 NOTE — Assessment & Plan Note (Signed)
Recommended she return for repeat colonoscopy as recommended one year ago. She is asymptomatic.

## 2020-08-12 ENCOUNTER — Other Ambulatory Visit: Payer: Self-pay

## 2020-08-12 ENCOUNTER — Encounter: Payer: Self-pay | Admitting: Podiatry

## 2020-08-12 ENCOUNTER — Ambulatory Visit (INDEPENDENT_AMBULATORY_CARE_PROVIDER_SITE_OTHER): Payer: Medicare Other

## 2020-08-12 ENCOUNTER — Ambulatory Visit (INDEPENDENT_AMBULATORY_CARE_PROVIDER_SITE_OTHER): Payer: Medicare Other | Admitting: Podiatry

## 2020-08-12 DIAGNOSIS — M898X7 Other specified disorders of bone, ankle and foot: Secondary | ICD-10-CM | POA: Diagnosis not present

## 2020-08-12 DIAGNOSIS — M2042 Other hammer toe(s) (acquired), left foot: Secondary | ICD-10-CM

## 2020-08-12 DIAGNOSIS — L603 Nail dystrophy: Secondary | ICD-10-CM | POA: Diagnosis not present

## 2020-08-12 DIAGNOSIS — L600 Ingrowing nail: Secondary | ICD-10-CM

## 2020-08-12 DIAGNOSIS — M67472 Ganglion, left ankle and foot: Secondary | ICD-10-CM | POA: Diagnosis not present

## 2020-08-12 NOTE — Patient Instructions (Signed)

## 2020-08-24 ENCOUNTER — Ambulatory Visit
Admission: RE | Admit: 2020-08-24 | Discharge: 2020-08-24 | Disposition: A | Discharge status: Home / Self Care | Payer: Medicare Other | Source: Ambulatory Visit | Attending: Primary Care | Admitting: Primary Care

## 2020-08-24 ENCOUNTER — Other Ambulatory Visit: Payer: Self-pay

## 2020-08-24 DIAGNOSIS — E2839 Other primary ovarian failure: Secondary | ICD-10-CM | POA: Diagnosis not present

## 2020-08-24 DIAGNOSIS — Z1231 Encounter for screening mammogram for malignant neoplasm of breast: Secondary | ICD-10-CM | POA: Insufficient documentation

## 2020-08-29 ENCOUNTER — Telehealth: Payer: Self-pay

## 2020-08-29 ENCOUNTER — Telehealth: Payer: Self-pay | Admitting: Podiatry

## 2020-08-29 NOTE — Telephone Encounter (Signed)
Bonnie Ramsey called to cancel her surgery with Dr. Amalia Hailey on 09/01/2020. She stated she had a family emergency and will need to wait till the fall to reschedule. I notified Dr. Amalia Hailey and left a message for Caren Griffins at [facility]

## 2020-08-29 NOTE — Telephone Encounter (Signed)
Patient called wanting to reschedule surgery date, patient stated she just wanted the office to be aware that she wanted to reschedule

## 2020-08-29 NOTE — Progress Notes (Signed)
HPI: 70 y.o. female presenting today as a new patient for evaluation of multiple complaints regarding the left foot.  First, the patient states that she has a painful thickened toenail to the left second toe.  She is concerned for possible ingrown since it is very sensitive with pressure.  She would like to have them removed today.  This is developed over several years.  And it has slowly become increasingly painful with shoe gear and close toed shoes.  Patient also complains of a painful knot to the top of her left midfoot that is been present for about 4 years now.  She states that it occasionally flares up at times.  Most recently it began approximately 4 days ago and it is extremely painful today.  Painful with close toed shoes.  She tries to wear shoes that do not irritate this area.  Past Medical History:  Diagnosis Date  . Blood in stool   . Chronic back pain   . Depression   . 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  Dermatology: Skin is warm, dry and supple bilateral lower extremities. Negative for open lesions or macerations.  Thickened hyperkeratotic dystrophic nail noted to the left second toe with associated tenderness to palpation  Vascular: Palpable pedal pulses bilaterally. No edema or erythema noted. Capillary refill within normal limits.  Neurological: Epicritic and protective threshold grossly intact bilaterally.   Musculoskeletal Exam: Range of motion within normal limits to all pedal and ankle joints bilateral. Muscle strength 5/5 in all groups bilateral.  Large palpable exostosis noted to the left midfoot with fluctuance overlying consistent with a ganglion cyst.  Associated tenderness to palpation  Radiographic Exam:  Normal osseous mineralization. Joint  spaces preserved. No fracture/dislocation/boney destruction.  Degenerative changes and dorsal exostosis noted to the midtarsal joint left foot  Assessment: 1.  Dystrophic nail/ingrown toenail left second digit 2.  Exostosis left midtarsal joint 3.  Overlying ganglion cyst left   Plan of Care:  1. Patient evaluated. X-Rays reviewed.  2.  In regards to the toenail to the left second digit, the patient opts for total temporary nail avulsion.  We discussed different treatment options both conservative and aggressive and the patient would like to have the nail temporarily removed to allow the possibility of a new nail to grow in that is more healthy and viable.  Details of the procedure were explained.  The toe was prepped in aseptic manner and digital block performed using 2% lidocaine plain.  The nail was avulsed in its entirety and light dressing applied.  Post care instructions provided 3. Today we discussed the conservative versus surgical management of the presenting pathology. The patient opts for surgical management. All possible complications and details of the procedure were explained. All patient questions were answered. No guarantees were expressed or implied. 4. Authorization for surgery was initiated today. Surgery will consist of tarsal exostectomy left dorsal midfoot.  Excision of ganglion cyst left foot. 5.  Return to clinic 1 week postop        Edrick Kins, DPM [facility]  Dr. Edrick Kins, DPM    [address]  [place], [place] [address]                Office [phone]  Fax [phone]

## 2020-09-02 ENCOUNTER — Telehealth: Payer: Self-pay

## 2020-09-02 NOTE — Telephone Encounter (Signed)
Just FYI  Pt had to cancel her f/u appt with you next week... Pt wanted you to know that she stopped the Sertraline and is currently stable... She is out of town and will reschedule when she returns

## 2020-09-02 NOTE — Telephone Encounter (Signed)
Noted, will remove from medication list

## 2020-09-02 NOTE — Addendum Note (Signed)
Addended by: Pleas Koch on: 09/02/2020 01:30 PM   Modules accepted: Orders

## 2020-09-09 ENCOUNTER — Ambulatory Visit: Payer: Medicare Other | Admitting: Primary Care

## 2020-09-09 ENCOUNTER — Encounter: Payer: Medicare Other | Admitting: Podiatry

## 2020-09-16 ENCOUNTER — Encounter: Payer: Medicare Other | Admitting: Podiatry

## 2020-09-23 ENCOUNTER — Other Ambulatory Visit: Payer: Self-pay | Admitting: Primary Care

## 2020-09-23 DIAGNOSIS — F4323 Adjustment disorder with mixed anxiety and depressed mood: Secondary | ICD-10-CM

## 2020-09-29 ENCOUNTER — Telehealth: Payer: Self-pay | Admitting: *Deleted

## 2020-09-29 DIAGNOSIS — M810 Age-related osteoporosis without current pathological fracture: Secondary | ICD-10-CM

## 2020-09-29 DIAGNOSIS — E2839 Other primary ovarian failure: Secondary | ICD-10-CM

## 2020-09-29 NOTE — Telephone Encounter (Signed)
See result note from May under her bone density scan. I was waiting for her to update [place].

## 2020-09-29 NOTE — Telephone Encounter (Signed)
I did not get a note on the patient for Prolia from what I see. I will go ahead and submit benefits now to check on this.

## 2020-09-29 NOTE — Telephone Encounter (Signed)
Pt called to check status of Prolia. She said PCP discussed either Prolia or fosamax and she decided she wanted the Prolia but hasn't heard anything about it. Not sure if this is already in process or not so I advise her I would route message to PCP and Anastasiya to f/u with her

## 2020-09-29 NOTE — Telephone Encounter (Signed)
Patient advised I am working on this and I apologized for the delay. Advised patient I will update her as soon as I have more information. Patient appreciated the call back.

## 2020-10-04 ENCOUNTER — Encounter: Payer: Medicare Other | Admitting: Podiatry

## 2020-10-13 ENCOUNTER — Other Ambulatory Visit: Payer: Self-pay | Admitting: Podiatry

## 2020-10-13 ENCOUNTER — Encounter: Payer: Self-pay | Admitting: Podiatry

## 2020-10-13 DIAGNOSIS — M25775 Osteophyte, left foot: Secondary | ICD-10-CM | POA: Diagnosis not present

## 2020-10-13 DIAGNOSIS — M67472 Ganglion, left ankle and foot: Secondary | ICD-10-CM | POA: Diagnosis not present

## 2020-10-13 MED ORDER — OXYCODONE-ACETAMINOPHEN 5-325 MG PO TABS: 1.0000 | ORAL_TABLET | ORAL | 0 refills | Status: DC | PRN

## 2020-10-13 MED ORDER — MELOXICAM 15 MG PO TABS: 15.0000 mg | ORAL_TABLET | Freq: Every day | ORAL | 1 refills | Status: DC

## 2020-10-13 NOTE — Progress Notes (Signed)
PRN postop 

## 2020-10-21 ENCOUNTER — Other Ambulatory Visit: Payer: Self-pay

## 2020-10-21 ENCOUNTER — Ambulatory Visit (INDEPENDENT_AMBULATORY_CARE_PROVIDER_SITE_OTHER): Payer: Medicare Other | Admitting: Podiatry

## 2020-10-21 ENCOUNTER — Encounter: Payer: Self-pay | Admitting: Podiatry

## 2020-10-21 ENCOUNTER — Ambulatory Visit (INDEPENDENT_AMBULATORY_CARE_PROVIDER_SITE_OTHER): Payer: Medicare Other

## 2020-10-21 VITALS — BP 121/77 | HR 51 | Temp 98.7°F | Resp 16

## 2020-10-21 DIAGNOSIS — M67472 Ganglion, left ankle and foot: Secondary | ICD-10-CM

## 2020-10-21 DIAGNOSIS — Z9889 Other specified postprocedural states: Secondary | ICD-10-CM

## 2020-10-21 DIAGNOSIS — M810 Age-related osteoporosis without current pathological fracture: Secondary | ICD-10-CM | POA: Insufficient documentation

## 2020-10-21 NOTE — Addendum Note (Signed)
Addended by: Kris Mouton on: 10/21/2020 11:09 AM   Modules accepted: Orders

## 2020-10-21 NOTE — Progress Notes (Signed)
   Subjective:  Patient presents today status post dorsal exostectomy left foot. DOS: 10/13/2020.  Patient states that she is doing very well.  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  Skin incisions appear to be well coapted with sutures intact. No sign of infectious process noted. No dehiscence. No active bleeding noted. Moderate edema noted to the surgical extremity.  Radiographic Exam:  Osteotomies sites appear to be stable with routine healing.  Assessment: 1. s/p dorsal midfoot exostectomy left. DOS: 10/13/2020   Plan of Care:  1. Patient was evaluated. X-rays reviewed 2.  Dressings changed. 3.  Recommend Ace wrap daily 4.  Patient may begin to transition out of the postsurgical shoe 5.  Return to clinic in 10 days for suture removal   Edrick Kins, DPM [facility]  Dr. Edrick Kins, DPM    [address], [place] [address]                Office [phone]  Fax [phone]

## 2020-10-21 NOTE — Telephone Encounter (Signed)
Lab orders signed.

## 2020-10-21 NOTE — Assessment & Plan Note (Signed)
As evidenced on recent bone density scan. Checking BMP, then will proceed with Prolia injections semi-annually.

## 2020-10-21 NOTE — Telephone Encounter (Signed)
Benefits received. OOP cost for patient is $0. Patient advised. Lab appointment made for 10/24/20 and Nurse Visit appointment on 10/27/20.  DX for osteoporosis is not in the chart yet. Routing to PCP to add diagnosed for BMP order. Thank you.

## 2020-10-21 NOTE — Addendum Note (Signed)
Addended by: Pleas Koch on: 10/21/2020 01:54 PM   Modules accepted: Orders

## 2020-10-24 ENCOUNTER — Other Ambulatory Visit (INDEPENDENT_AMBULATORY_CARE_PROVIDER_SITE_OTHER): Payer: Medicare Other

## 2020-10-24 ENCOUNTER — Other Ambulatory Visit: Payer: Self-pay

## 2020-10-24 DIAGNOSIS — E2839 Other primary ovarian failure: Secondary | ICD-10-CM | POA: Diagnosis not present

## 2020-10-24 LAB — BASIC METABOLIC PANEL
BUN: 13 mg/dL (ref 6–23)
CO2: 28 mEq/L (ref 19–32)
Calcium: 9.8 mg/dL (ref 8.4–10.5)
Chloride: 103 mEq/L (ref 96–112)
Creatinine, Ser: 0.87 mg/dL (ref 0.40–1.20)
GFR: 67.11 mL/min (ref >60.00)
Glucose, Bld: 92 mg/dL (ref 70–99)
Potassium: 4.3 mEq/L (ref 3.5–5.1)
Sodium: 138 mEq/L (ref 135–145)

## 2020-10-25 NOTE — Telephone Encounter (Signed)
Creatine Clearance is 54.77 mL/min,  See lab note-ok to proceed with injection

## 2020-10-27 ENCOUNTER — Ambulatory Visit (INDEPENDENT_AMBULATORY_CARE_PROVIDER_SITE_OTHER): Payer: Medicare Other

## 2020-10-27 ENCOUNTER — Other Ambulatory Visit: Payer: Self-pay

## 2020-10-27 DIAGNOSIS — M810 Age-related osteoporosis without current pathological fracture: Secondary | ICD-10-CM

## 2020-10-27 MED ORDER — DENOSUMAB 60 MG/ML ~~LOC~~ SOSY
60.0000 mg | PREFILLED_SYRINGE | Freq: Once | SUBCUTANEOUS | Status: AC
  Administered 2020-10-27: 60 mg via SUBCUTANEOUS

## 2020-10-27 NOTE — Progress Notes (Signed)
Per orders of Dr. Waunita Schooner , injection of Prolia  given by Francella Solian in left subcutaneous  Patient tolerated injection well. Patient will Receive call in 6 months to schedule next injection

## 2020-10-28 ENCOUNTER — Encounter: Payer: Medicare Other | Admitting: Podiatry

## 2020-11-01 ENCOUNTER — Ambulatory Visit (INDEPENDENT_AMBULATORY_CARE_PROVIDER_SITE_OTHER): Payer: Medicare Other | Admitting: Podiatry

## 2020-11-01 ENCOUNTER — Other Ambulatory Visit: Payer: Self-pay

## 2020-11-01 DIAGNOSIS — Z9889 Other specified postprocedural states: Secondary | ICD-10-CM

## 2020-11-01 NOTE — Progress Notes (Signed)
   Subjective:  Patient presents today status post dorsal exostectomy left foot. DOS: 10/13/2020.  Patient states that she is doing very well.  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  Skin incisions appear to be well coapted with sutures intact. No sign of infectious process noted. No dehiscence. No active bleeding noted. Moderate edema noted to the surgical extremity.   Assessment: 1. s/p dorsal midfoot exostectomy left. DOS: 10/13/2020   Plan of Care:  1. Patient was evaluated.  Sutures removed today 2.  Compression ankle sleeve dispensed.  Wear daily 3.  Recommend good supportive shoes and sneakers 4.  Patient may slowly increase her activity to full activity no restrictions 5.  Return to clinic as needed   Edrick Kins, DPM [facility]  Dr. Edrick Kins, DPM    [address], [place] [address]                Office [phone]  Fax [phone]

## 2020-11-11 ENCOUNTER — Encounter: Payer: Medicare Other | Admitting: Podiatry

## 2021-02-07 ENCOUNTER — Telehealth: Payer: Self-pay

## 2021-02-07 NOTE — Telephone Encounter (Signed)
It looks like patient has a preoperative clearance appointment scheduled for 02/17/2021.

## 2021-02-07 NOTE — Telephone Encounter (Signed)
Received preoprative clearance form. Placed in your box for review. Do I need to call patient to make office visit?

## 2021-02-17 ENCOUNTER — Ambulatory Visit: Payer: Medicare Other | Admitting: Primary Care

## 2021-02-17 ENCOUNTER — Encounter: Payer: Self-pay | Admitting: Primary Care

## 2021-02-17 ENCOUNTER — Other Ambulatory Visit: Payer: Self-pay

## 2021-02-17 ENCOUNTER — Ambulatory Visit (INDEPENDENT_AMBULATORY_CARE_PROVIDER_SITE_OTHER): Payer: Medicare Other | Admitting: Primary Care

## 2021-02-17 VITALS — BP 121/76 | HR 99 | Temp 97.6°F | Ht 64.0 in | Wt 133.0 lb

## 2021-02-17 DIAGNOSIS — Z01818 Encounter for other preprocedural examination: Secondary | ICD-10-CM | POA: Diagnosis not present

## 2021-02-17 DIAGNOSIS — Z23 Encounter for immunization: Secondary | ICD-10-CM | POA: Diagnosis not present

## 2021-02-17 DIAGNOSIS — R001 Bradycardia, unspecified: Secondary | ICD-10-CM | POA: Diagnosis not present

## 2021-02-17 NOTE — Progress Notes (Signed)
Subjective:    Patient ID: Bonnie Ramsey, female    DOB: [birth year], 70 y.o.   MRN: [id number]  HPI  Bonnie Ramsey is a very pleasant 70 y.o. female with a history of rectal bleeding, ulceration of intestine, sleep disturbance, chronic back pain, chronic left knee pain who presents today for preoperative clearance.  She will undergo left total knee arthroplasty revision on 05/03/2021 per Dr. Maureen Ralphs.   She has no complaints today.  History of abnormal ECG's with bradycardia in the 40's. Has been evaluated by cardiology in the past, undergone a stress test in early 2000's, normal per patient. She's been told that her lower heart rate was "nothing to worry about". She comes today with records including office notes and ECG's from cardiology. She has not seen cardiology since 2017 prior to her lower back surgery.   She has preoperative labs scheduled for early January 2023.   Review of Systems  Constitutional:  Negative for unexpected weight change.  HENT:  Negative for rhinorrhea.   Eyes:  Negative for visual disturbance.  Respiratory:  Negative for shortness of breath.   Cardiovascular:  Negative for chest pain.  Gastrointestinal:  Negative for constipation and diarrhea.  Genitourinary:  Negative for difficulty urinating.  Musculoskeletal:  Positive for arthralgias.  Skin:  Negative for rash.  Allergic/Immunologic: Negative for environmental allergies.  Neurological:  Negative for dizziness, numbness and headaches.        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    right Chest treated with excision     Social History   Socioeconomic History   Marital status: Single    Spouse name: Not on file   Number of children: Not on file   Years of education: Not on file   Highest education level:  Not on file  Occupational History   Not on file  Tobacco Use   Smoking status: Never   Smokeless tobacco: Never  Substance and Sexual Activity   Alcohol use: Yes   Drug use: Not on file   Sexual activity: Not on file  Other Topics Concern   Not on file  Social History Narrative   Not on file   Social Determinants of Health   Financial Resource Strain: Low Risk    Difficulty of Paying Living Expenses: Not hard at all  Food Insecurity: No Food Insecurity   Worried About Running Out of Food in the Last Year: Never true   Ran Out of Food in the Last Year: Never true  Transportation Needs: No Transportation Needs   Lack of Transportation (Medical): No   Lack of Transportation (Non-Medical): No  Physical Activity: Sufficiently Active   Days of Exercise per Week: 3 days   Minutes of Exercise per Session: 60 min  Stress: Stress Concern Present   Feeling of Stress : To some extent  Social Connections: Not on file  Intimate Partner Violence: Not At Risk   Fear of Current or Ex-Partner: No   Emotionally Abused: No   Physically Abused: No   Sexually Abused: No    Past Surgical History:  Procedure Laterality Date   BREAST CYST ASPIRATION     COLONOSCOPY WITH PROPOFOL N/A 09/03/2019   Procedure: COLONOSCOPY WITH PROPOFOL;  Surgeon:  Virgel Manifold, MD;  Location: [facility] ENDOSCOPY;  Service: Endoscopy;  Laterality: N/A;   SPINAL FUSION  2015, 2016, 2017   Lumbar    Family History  Problem Relation Age of Onset   Breast cancer Mother 32    Allergies  Allergen Reactions   Cipro [Ciprofloxacin-Ciproflox Hcl Er] Hives   Morphine And Related Nausea And Vomiting    Current Outpatient Medications on File Prior to Visit  Medication Sig Dispense Refill   acetaminophen (TYLENOL) 325 MG tablet TAKE TWO TABLETS BY MOUTH TWO TIMES A DAY     b complex vitamins tablet Take 1 tablet by mouth daily.     Calcium Carb-Cholecalciferol 600-400 MG-UNIT TABS Take 1 tablet by mouth daily.      cholecalciferol (VITAMIN D3) 25 MCG (1000 UNIT) tablet Take 1,000 Units by mouth daily.     fluticasone (FLONASE) 50 MCG/ACT nasal spray fluticasone propionate 50 mcg/actuation nasal spray,suspension     gabapentin (NEURONTIN) 300 MG capsule Take 1 capsule (300 mg total) by mouth 3 (three) times daily. For back pain. 270 capsule 3   Omega 3-6-9 Fatty Acids (OMEGA 3-6-9 COMPLEX PO) Take by mouth.     No current facility-administered medications on file prior to visit.    BP 121/76   Pulse 99   Temp 97.6 F (36.4 C) (Temporal)   Ht 5\' 4"  (1.626 m)   Wt 133 lb (60.3 kg)   SpO2 99%   BMI 22.83 kg/m  Objective:   Physical Exam HENT:     Right Ear: Tympanic membrane and ear canal normal.     Left Ear: Tympanic membrane and ear canal normal.     Nose: Nose normal.  Eyes:     Conjunctiva/sclera: Conjunctivae normal.     Pupils: Pupils are equal, round, and reactive to light.  Neck:     Thyroid: No thyromegaly.  Cardiovascular:     Rate and Rhythm: Regular rhythm. Bradycardia present.     Heart sounds: No murmur heard. Pulmonary:     Effort: Pulmonary effort is normal.     Breath sounds: Normal breath sounds. No rales.  Abdominal:     General: Bowel sounds are normal.     Palpations: Abdomen is soft.     Tenderness: There is no abdominal tenderness.  Musculoskeletal:     Cervical back: Neck supple.     Right knee: Normal.     Left knee: No bony tenderness. Decreased range of motion.  Lymphadenopathy:     Cervical: No cervical adenopathy.  Skin:    General: Skin is warm and dry.     Findings: No rash.  Neurological:     Mental Status: She is alert and oriented to person, place, and time.     Cranial Nerves: No cranial nerve deficit.     Deep Tendon Reflexes: Reflexes are normal and symmetric.  Psychiatric:        Mood and Affect: Mood normal.          Assessment & Plan:      This visit occurred during the SARS-CoV-2 public health emergency.  Safety protocols  were in place, including screening questions prior to the visit, additional usage of staff PPE, and extensive cleaning of exam room while observing appropriate contact time as indicated for disinfecting solutions.

## 2021-02-17 NOTE — Patient Instructions (Signed)
You will be contacted regarding your referral to cardiology.  Please let us know if you have not been contacted within two weeks.   It was a pleasure to see you today!

## 2021-02-17 NOTE — Assessment & Plan Note (Addendum)
Asymptomatic.  Reviewed cardiology notes, stress test, and ECG's. 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 Her last evaluation was 2017 so we both agreed for updated cardiology visit for surgical clearance.  Referral placed to cardiology.

## 2021-02-17 NOTE — Assessment & Plan Note (Addendum)
Pending left total knee arthroplasty revision per Dr. Maureen Ralphs.  Reviewed cardiology notes, stress test, and ECG's. 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 Her last evaluation was 2017 so we both agreed for updated cardiology visit for surgical clearance.  ECG with sinus brady with rate of 45. Left axis. No PAC/PVC. Does appear similar to ECG's brought in today. Will scan in all ECG's and records.  Referral placed to cardiology.  Labs in EMR reviewed. She is medically cleared to proceed with surgery. Will await cardiology notes.

## 2021-02-28 ENCOUNTER — Ambulatory Visit (INDEPENDENT_AMBULATORY_CARE_PROVIDER_SITE_OTHER): Payer: Medicare Other

## 2021-02-28 ENCOUNTER — Ambulatory Visit (INDEPENDENT_AMBULATORY_CARE_PROVIDER_SITE_OTHER): Payer: Medicare Other | Admitting: Podiatry

## 2021-02-28 ENCOUNTER — Other Ambulatory Visit: Payer: Self-pay

## 2021-02-28 DIAGNOSIS — M722 Plantar fascial fibromatosis: Secondary | ICD-10-CM | POA: Diagnosis not present

## 2021-02-28 MED ORDER — MELOXICAM 15 MG PO TABS: 15.0000 mg | ORAL_TABLET | Freq: Every day | ORAL | 1 refills | Status: DC

## 2021-02-28 MED ORDER — BETAMETHASONE SOD PHOS & ACET 6 (3-3) MG/ML IJ SUSP
3.0000 mg | Freq: Once | INTRAMUSCULAR | Status: AC
  Administered 2021-02-28: 3 mg via INTRA_ARTICULAR

## 2021-02-28 NOTE — Progress Notes (Signed)
   Subjective: 70 y.o. female for evaluation of left heel pain is been going on for a few weeks now.  She says that she developed pain in the heel and is very tender and symptomatic.  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  Dermatology: Skin is warm, dry and supple bilateral lower extremities. Negative for open lesions or macerations bilateral.   Vascular: Dorsalis Pedis and Posterior Tibial pulses palpable bilateral.  Capillary fill time is immediate to all digits.  Neurological: Epicritic and protective threshold intact bilateral.   Musculoskeletal: Tenderness to palpation to the plantar aspect of the left heel along the plantar fascia. All other joints range of motion within normal limits bilateral. Strength 5/5 in all groups bilateral.   Radiographic exam: Normal osseous mineralization. Joint spaces preserved. No fracture/dislocation/boney destruction. No other soft tissue abnormalities or radiopaque foreign bodies.   Assessment: 1. Plantar fasciitis left foot  Plan of Care:  1. Patient evaluated. Xrays reviewed.   2. Injection of 0.5cc Celestone soluspan injected into the left plantar fascia.  3.  Continue wearing good supportive shoes and insoles 4. Rx for Meloxicam ordered for patient. 5.  Instructed patient regarding therapies and modalities at home to alleviate symptoms.  6.  Return to clinic as needed   Edrick Kins, DPM [facility]  Dr. Edrick Kins, DPM    [address], [place] [address]                Office [phone]  Fax [phone]

## 2021-03-13 ENCOUNTER — Other Ambulatory Visit: Payer: Self-pay

## 2021-03-13 ENCOUNTER — Encounter: Payer: Self-pay | Admitting: Cardiology

## 2021-03-13 ENCOUNTER — Ambulatory Visit (INDEPENDENT_AMBULATORY_CARE_PROVIDER_SITE_OTHER): Payer: Medicare Other | Admitting: Cardiology

## 2021-03-13 VITALS — BP 118/72 | HR 57 | Ht 65.0 in | Wt 135.0 lb

## 2021-03-13 DIAGNOSIS — Z01818 Encounter for other preprocedural examination: Secondary | ICD-10-CM | POA: Diagnosis not present

## 2021-03-13 DIAGNOSIS — R001 Bradycardia, unspecified: Secondary | ICD-10-CM

## 2021-03-13 NOTE — Progress Notes (Signed)
Cardiology Office Note:    Date:  03/13/2021   ID:  Bonnie Ramsey, DOB 30-Aug-1950, MRN [id number]  PCP:  Bonnie Koch, NP   [facility] Providers Cardiologist:  None     Referring MD: Bonnie Koch, NP   Chief Complaint  Patient presents with   New Patient (Initial Visit)    Referred by PCP for Surgical Clearance. Meds reviewed verbally with patient.     History of Present Illness:    ILEE Ramsey is a 70 y.o. female with a hx of arthritis who presents due to preop evaluation.  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  The left knee has become unstable and there are plans to repeat left knee surgery.  She denies chest pain, shortness of breath or palpitations.  She has a history of sinus bradycardia, underwent stress testing years ago which was normal.  Denies smoking, denies any family history of heart disease.  She feels well, has no other concerns at this time.  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   Calcium Carb-Cholecalciferol 600-400 MG-UNIT TABS Take 1 tablet by mouth daily.   cholecalciferol (VITAMIN D3) 25 MCG (1000 UNIT) tablet Take 1,000 Units by mouth daily.   fluticasone (FLONASE) 50  MCG/ACT nasal spray fluticasone propionate 50 mcg/actuation nasal spray,suspension   gabapentin (NEURONTIN) 300 MG capsule Take 1 capsule (300 mg total) by mouth 3 (three) times daily. For back pain.   meloxicam (MOBIC) 15 MG tablet Take 1 tablet (15 mg total) by mouth daily.   Omega 3-6-9 Fatty Acids (OMEGA 3-6-9 COMPLEX PO) Take by mouth.     Allergies:   Cipro [ciprofloxacin-ciproflox hcl er] and Morphine and related   Social History   Socioeconomic History   Marital status: Single    Spouse name: Not on file   Number of children: Not on file   Years of education: Not on file   Highest education level: Not on file  Occupational History   Not on file  Tobacco Use   Smoking status: Never   Smokeless tobacco: Never  Substance and Sexual Activity   Alcohol use: Yes   Drug use: Not on file   Sexual activity: Not on file  Other Topics Concern   Not on file  Social History Narrative   Not on file   Social Determinants of Health   Financial Resource Strain: Low Risk    Difficulty of Paying Living Expenses: Not hard at all  Food Insecurity: No Food Insecurity   Worried About [place] in the Last Year: Never true   Ran  Out of Food in the Last Year: Never true  Transportation Needs: No Transportation Needs   Lack of Transportation (Medical): No   Lack of Transportation (Non-Medical): No  Physical Activity: Sufficiently Active   Days of Exercise per Week: 3 days   Minutes of Exercise per Session: 60 min  Stress: Stress Concern Present   Feeling of Stress : To some extent  Social Connections: Not on file     Family History: The patient's family history includes Breast cancer (age of onset: 31) in her mother.  ROS:   Please see the history of present illness.     All other systems reviewed and are negative.  EKGs/Labs/Other Studies Reviewed:    The following studies were reviewed today:   EKG:  EKG not ordered today.    The ekg obtained 02/17/2021  demonstrates sinus bradycardia,  Recent Labs: 07/27/2020: ALT 12 10/24/2020: BUN 13; Creatinine, Ser 0.87; Potassium 4.3; Sodium 138  Recent Lipid Panel    Component Value Date/Time   CHOL 252 (H) 07/27/2020 1016   TRIG 51.0 07/27/2020 1016   HDL 138.80 07/27/2020 1016   CHOLHDL 2 07/27/2020 1016   VLDL 10.2 07/27/2020 1016   LDLCALC 103 (H) 07/27/2020 1016     Risk Assessment/Calculations:          Physical Exam:    VS:  BP 118/72 (BP Location: Left Arm, Patient Position: Sitting, Cuff Size: Normal)   Pulse (!) 57   Ht 5\' 5"  (1.651 m)   Wt 135 lb (61.2 kg)   SpO2 98%   BMI 22.47 kg/m     Wt Readings from Last 3 Encounters:  03/13/21 135 lb (61.2 kg)  02/17/21 133 lb (60.3 kg)  07/27/20 129 lb (58.5 kg)     GEN:  Well nourished, well developed in no acute distress HEENT: Normal NECK: No JVD; No carotid bruits LYMPHATICS: No lymphadenopathy CARDIAC: Regular, bradycardic. RESPIRATORY:  Clear to auscultation without rales, wheezing or rhonchi  ABDOMEN: Soft, non-tender, non-distended MUSCULOSKELETAL:  No edema; No deformity  SKIN: Warm and dry NEUROLOGIC:  Alert and oriented x 3 PSYCHIATRIC:  Normal affect   ASSESSMENT:    1. Pre-op evaluation   2. Bradycardia    PLAN:    In order of problems listed above:  Preop evaluation prior to left knee surgery.  Patient has no cardiac symptoms of chest pain or shortness of breath.  Has no cardiac risk factors.  Okay to proceed with procedure from a cardiac perspective with no additional testing as per [facility] AHA guidelines. Bradycardia, sinus bradycardia, clinically asymptomatic, no indication for additional testing, no indication for pacemaker.  Follow-up as needed       Medication Adjustments/Labs and Tests Ordered: Current medicines are reviewed at length with the patient today.  Concerns regarding medicines are outlined above.  No orders of the defined types were placed in this encounter.  No orders of the  defined types were placed in this encounter.   Patient Instructions  Medication Instructions:  Your physician recommends that you continue on your current medications as directed. Please refer to the Current Medication list given to you today.  *If you need a refill on your cardiac medications before your next appointment, please call your pharmacy*   Lab Work: None ordered If you have labs (blood work) drawn today and your tests are completely normal, you will receive your results only by: [place] (if you have MyChart) OR A paper copy in the mail If you have  any lab test that is abnormal or we need to change your treatment, we will call you to review the results.   Testing/Procedures: None ordered   Follow-Up: At [facility], you and your health needs are our priority.  As part of our continuing mission to provide you with exceptional heart care, we have created designated Provider Care Teams.  These Care Teams include your primary Cardiologist (physician) and Advanced Practice Providers (APPs -  Physician Assistants and Nurse Practitioners) who all work together to provide you with the care you need, when you need it.  We recommend signing up for the patient portal called "MyChart".  Sign up information is provided on this After Visit Summary.  MyChart is used to connect with patients for Virtual Visits (Telemedicine).  Patients are able to view lab/test results, encounter notes, upcoming appointments, etc.  Non-urgent messages can be sent to your provider as well.   To learn more about what you can do with MyChart, go to [email or web address].    Your next appointment:   Follow up as needed   The format for your next appointment:   In Person  Provider:   You may see Dr. Garen Lah or one of the following Advanced Practice Providers on your designated Care Team:   Murray Hodgkins, NP Christell Faith, PA-C Cadence Kathlen Mody, [place]   Other Instructions     Signed, Kate Sable, MD  03/13/2021 10:15 AM    [place]

## 2021-03-13 NOTE — Patient Instructions (Signed)
Medication Instructions:  Your physician recommends that you continue on your current medications as directed. Please refer to the Current Medication list given to you today.  *If you need a refill on your cardiac medications before your next appointment, please call your pharmacy*   Lab Work: None ordered If you have labs (blood work) drawn today and your tests are completely normal, you will receive your results only by: [place] (if you have MyChart) OR A paper copy in the mail If you have any lab test that is abnormal or we need to change your treatment, we will call you to review the results.   Testing/Procedures: None ordered   Follow-Up: At [facility], you and your health needs are our priority.  As part of our continuing mission to provide you with exceptional heart care, we have created designated Provider Care Teams.  These Care Teams include your primary Cardiologist (physician) and Advanced Practice Providers (APPs -  Physician Assistants and Nurse Practitioners) who all work together to provide you with the care you need, when you need it.  We recommend signing up for the patient portal called "MyChart".  Sign up information is provided on this After Visit Summary.  MyChart is used to connect with patients for Virtual Visits (Telemedicine).  Patients are able to view lab/test results, encounter notes, upcoming appointments, etc.  Non-urgent messages can be sent to your provider as well.   To learn more about what you can do with MyChart, go to [email or web address].    Your next appointment:   Follow up as needed   The format for your next appointment:   In Person  Provider:   You may see Dr. Garen Lah or one of the following Advanced Practice Providers on your designated Care Team:   Murray Hodgkins, NP Christell Faith, PA-C Cadence Kathlen Mody, [place]   Other Instructions

## 2021-04-13 ENCOUNTER — Other Ambulatory Visit (HOSPITAL_COMMUNITY): Payer: Self-pay

## 2021-04-21 NOTE — Patient Instructions (Addendum)
DUE TO COVID-19 ONLY ONE VISITOR IS ALLOWED TO COME WITH YOU AND STAY IN THE WAITING ROOM ONLY DURING PRE OP AND PROCEDURE DAY OF SURGERY IF YOU ARE GOING HOME AFTER SURGERY. IF YOU ARE SPENDING THE NIGHT 2 PEOPLE MAY VISIT WITH YOU IN YOUR PRIVATE ROOM AFTER SURGERY UNTIL VISITING  HOURS ARE OVER AT 8:00 PM AND 1  VISITOR  MAY  SPEND THE NIGHT.   YOU NEED TO HAVE A COVID 19 TEST ON___1/16/23____ @__8 :45_____, THIS TEST MUST BE DONE BEFORE SURGERY,   Come in through the main entrance at [facility] and sit in the chairs to the right. Call  [phone] and the nurse will  come and get you. Don't stop at admitting.                Bonnie Ramsey     Your procedure is scheduled on: 05/03/21   Report to [facility] Main  Entrance   Report to admitting at 1:05 PM     Call this number if you have problems the morning of surgery [phone]    No food after midnight.    You may have clear liquid until 12:30 PM    At 12:15 PM drink pre surgery drink.   Nothing by mouth after 12:30 PM   CLEAR LIQUID DIET   Foods Allowed                                                                     Foods Excluded  Coffee and tea, regular and decaf                             liquids that you cannot  Plain Jell-O any favor except red or purple                                           see through such as: Fruit ices (not with fruit pulp)                                     milk, soups, orange juice  Iced Popsicles                                    All solid food Carbonated beverages, regular and diet                                    Cranberry, grape and apple juices Sports drinks like Gatorade Lightly seasoned clear broth or consume(fat free) Sugar     BRUSH YOUR TEETH MORNING OF SURGERY AND RINSE YOUR MOUTH OUT, NO CHEWING GUM CANDY OR MINTS.     Take these medicines the morning of surgery with A SIP OF WATER: Gabapentin  You may not have any  metal on your body including              piercings  Do not wear jewelry, lotions, powders or  deodorant, make up.             No nail polish or product on fingers or toes.               Do not bring valuables to the hospital. [place].  Contacts, dentures or bridgework may not be worn into surgery.  Leave suitcase in the car. After surgery it may be brought to your room.               [place] - Preparing for Surgery Before surgery, you can play an important role.  Because skin is not sterile, your skin needs to be as free of germs as possible.  You can reduce the number of germs on your skin by washing with CHG (chlorahexidine gluconate) soap before surgery.  CHG is an antiseptic cleaner which kills germs and bonds with the skin to continue killing germs even after washing. Please DO NOT use if you have an allergy to CHG or antibacterial soaps.  If your skin becomes reddened/irritated stop using the CHG and inform your nurse when you arrive at Short Stay. Do not shave (including legs and underarms) for at least 48 hours prior to the first CHG shower.   Please follow these instructions carefully:  1.  Shower with CHG Soap the night before surgery and the  morning of Surgery.  2.  If you choose to wash your hair, wash your hair first as usual with your  normal  shampoo.  3.  After you shampoo, rinse your hair and body thoroughly to remove the  shampoo.                            4.  Use CHG as you would any other liquid soap.  You can apply chg directly  to the skin and wash                       Gently with a scrungie or clean washcloth.  5.  Apply the CHG Soap to your body ONLY FROM THE NECK DOWN.   Do not use on face/ open                           Wound or open sores. Avoid contact with eyes, ears mouth and genitals (private parts).                       Wash face,  Genitals (private parts) with your normal soap.             6.  Wash  thoroughly, paying special attention to the area where your surgery  will be performed.  7.  Thoroughly rinse your body with warm water from the neck down.  8.  DO NOT shower/wash with your normal soap after using and rinsing off  the CHG Soap.                9.  Pat yourself dry with a clean towel.            10.  Wear clean pajamas.            11.  Place clean sheets on your bed the night of your first shower and do not  sleep with pets. Day of Surgery : Do not apply any lotions/deodorants the morning of surgery.  Please wear clean clothes to the hospital/surgery center.  FAILURE TO FOLLOW THESE INSTRUCTIONS MAY RESULT IN THE CANCELLATION OF YOUR SURGERY PATIENT SIGNATURE_________________________________  NURSE SIGNATURE__________________________________  ________________________________________________________________________   Bonnie Ramsey  An incentive spirometer is a tool that can help keep your lungs clear and active. This tool measures how well you are filling your lungs with each breath. Taking long deep breaths may help reverse or decrease the chance of developing breathing (pulmonary) problems (especially infection) following: A long period of time when you are unable to move or be active. BEFORE THE PROCEDURE  If the spirometer includes an indicator to show your best effort, your nurse or respiratory therapist will set it to a desired goal. If possible, sit up straight or lean slightly forward. Try not to slouch. Hold the incentive spirometer in an upright position. INSTRUCTIONS FOR USE  Sit on the edge of your bed if possible, or sit up as far as you can in bed or on a chair. Hold the incentive spirometer in an upright position. Breathe out normally. Place the mouthpiece in your mouth and seal your lips tightly around it. Breathe in slowly and as deeply as possible, raising the piston or the ball toward the top of the column. Hold your breath for 3-5 seconds or for  as long as possible. Allow the piston or ball to fall to the bottom of the column. Remove the mouthpiece from your mouth and breathe out normally. Rest for a few seconds and repeat Steps 1 through 7 at least 10 times every 1-2 hours when you are awake. Take your time and take a few normal breaths between deep breaths. The spirometer may include an indicator to show your best effort. Use the indicator as a goal to work toward during each repetition. After each set of 10 deep breaths, practice coughing to be sure your lungs are clear. If you have an incision (the cut made at the time of surgery), support your incision when coughing by placing a pillow or rolled up towels firmly against it. Once you are able to get out of bed, walk around indoors and cough well. You may stop using the incentive spirometer when instructed by your caregiver.  RISKS AND COMPLICATIONS Take your time so you do not get dizzy or light-headed. If you are in pain, you may need to take or ask for pain medication before doing incentive spirometry. It is harder to take a deep breath if you are having pain. AFTER USE Rest and breathe slowly and easily. It can be helpful to keep track of a log of your progress. Your caregiver can provide you with a simple table to help with this. If you are using the spirometer at home, follow these instructions: [place] IF:  You are having difficultly using the spirometer. You have trouble using the spirometer as often as instructed. Your pain medication is not giving enough relief while using the spirometer. You develop fever of 100.5 F (38.1 C) or higher. SEEK IMMEDIATE MEDICAL CARE IF:  You cough up bloody sputum that had not been present before. You develop fever of 102 F (38.9 C) or greater. You develop worsening pain at or near the incision  site. MAKE SURE YOU:  Understand these instructions. Will watch your condition. Will get help right away if you are not doing well  or get worse. Document Released: 08/13/2006 Document Revised: 06/25/2011 Document Reviewed: 10/14/2006 [facility] Patient Information 2014 [place], [place].   ________________________________________________________________________

## 2021-04-24 ENCOUNTER — Encounter (HOSPITAL_COMMUNITY)
Admission: RE | Admit: 2021-04-24 | Discharge: 2021-04-24 | Disposition: A | Discharge status: Home / Self Care | Payer: Medicare Other | Source: Ambulatory Visit | Attending: Orthopedic Surgery | Admitting: Orthopedic Surgery

## 2021-04-24 ENCOUNTER — Encounter (HOSPITAL_COMMUNITY): Payer: Self-pay

## 2021-04-24 ENCOUNTER — Other Ambulatory Visit: Payer: Self-pay

## 2021-04-24 VITALS — BP 127/77 | HR 47 | Temp 97.8°F | Resp 16 | Ht 65.0 in | Wt 134.5 lb

## 2021-04-24 DIAGNOSIS — Y838 Other surgical procedures as the cause of abnormal reaction of the patient, or of later complication, without mention of misadventure at the time of the procedure: Secondary | ICD-10-CM | POA: Diagnosis not present

## 2021-04-24 DIAGNOSIS — Z96659 Presence of unspecified artificial knee joint: Secondary | ICD-10-CM | POA: Diagnosis not present

## 2021-04-24 DIAGNOSIS — T84018A Broken internal joint prosthesis, other site, initial encounter: Secondary | ICD-10-CM | POA: Diagnosis not present

## 2021-04-24 DIAGNOSIS — Z01818 Encounter for other preprocedural examination: Secondary | ICD-10-CM

## 2021-04-24 DIAGNOSIS — Z01812 Encounter for preprocedural laboratory examination: Secondary | ICD-10-CM | POA: Insufficient documentation

## 2021-04-24 LAB — COMPREHENSIVE METABOLIC PANEL
ALT: 15 U/L (ref 0–44)
AST: 20 U/L (ref 15–41)
Albumin: 4.1 g/dL (ref 3.5–5.0)
Alkaline Phosphatase: 52 U/L (ref 38–126)
Anion gap: 7 (ref 5–15)
BUN: 8 mg/dL (ref 8–23)
CO2: 26 mmol/L (ref 22–32)
Calcium: 9.3 mg/dL (ref 8.9–10.3)
Chloride: 106 mmol/L (ref 98–111)
Creatinine, Ser: 0.76 mg/dL (ref 0.44–1.00)
GFR, Estimated: >60 mL/min (ref >60)
Glucose, Bld: 97 mg/dL (ref 70–99)
Potassium: 4 mmol/L (ref 3.5–5.1)
Sodium: 139 mmol/L (ref 135–145)
Total Bilirubin: 0.6 mg/dL (ref 0.3–1.2)
Total Protein: 6.6 g/dL (ref 6.5–8.1)

## 2021-04-24 LAB — SURGICAL PCR SCREEN
MRSA, PCR: NEGATIVE
Staphylococcus aureus: NEGATIVE

## 2021-04-24 LAB — CBC
HCT: 38.5 % (ref 36.0–46.0)
Hemoglobin: 12.7 g/dL (ref 12.0–15.0)
MCH: 30.2 pg (ref 26.0–34.0)
MCHC: 33 g/dL (ref 30.0–36.0)
MCV: 91.4 fL (ref 80.0–100.0)
Platelets: 241 10*3/uL (ref 150–400)
RBC: 4.21 MIL/uL (ref 3.87–5.11)
RDW: 13.2 % (ref 11.5–15.5)
WBC: 3.2 10*3/uL — ABNORMAL LOW (ref 4.0–10.5)
nRBC: 0 % (ref 0.0–0.2)

## 2021-04-24 LAB — PROTIME-INR
INR: 1.1 (ref 0.8–1.2)
Prothrombin Time: 13.7 seconds (ref 11.4–15.2)

## 2021-04-24 NOTE — Progress Notes (Signed)
COVID test- 05/01/21 at 8:45 am  PCP - [place]ian Co NP Cardiologist - none  Chest x-ray - no EKG - no Stress Test - no ECHO - no Cardiac Cath - no Pacemaker/ICD device last checked:NA  Sleep Study - no CPAP -   Fasting Blood Sugar - NA Checks Blood Sugar _____ times a day  Blood Thinner Instructions:NA Aspirin Instructions: Last Dose:  Anesthesia review: yes  Patient denies shortness of breath, fever, cough and chest pain at PAT appointment Pt has always has sinus bradycardia.  She has hardware in her back from several back surgeries.  Patient verbalized understanding of instructions that were given to them at the PAT appointment. Patient was also instructed that they will need to review over the PAT instructions again at home before surgery. Yes

## 2021-04-26 ENCOUNTER — Telehealth: Payer: Self-pay

## 2021-04-26 DIAGNOSIS — M810 Age-related osteoporosis without current pathological fracture: Secondary | ICD-10-CM

## 2021-04-26 NOTE — Telephone Encounter (Signed)
Benefits submitted for Prolia-waiting for response.  [name], I see patient is scheduled to have total knee revision on 05/03/21 She is due for Prolia any day after 04/30/21. Does she need to post pone the injection or any other feedback on this?

## 2021-04-27 NOTE — Telephone Encounter (Signed)
Lets have her hold off on her Prolia injection until she has recovered from her surgery. You can try calling her back around mid to late February

## 2021-04-27 NOTE — Telephone Encounter (Signed)
Noted  

## 2021-04-28 NOTE — Telephone Encounter (Signed)
Patient advised. Will call back in February.

## 2021-05-01 ENCOUNTER — Encounter (HOSPITAL_COMMUNITY)
Admission: RE | Admit: 2021-05-01 | Discharge: 2021-05-01 | Disposition: A | Discharge status: Home / Self Care | Payer: Medicare Other | Source: Ambulatory Visit | Attending: Orthopedic Surgery | Admitting: Orthopedic Surgery

## 2021-05-01 ENCOUNTER — Other Ambulatory Visit: Payer: Self-pay

## 2021-05-01 DIAGNOSIS — Z20822 Contact with and (suspected) exposure to covid-19: Secondary | ICD-10-CM | POA: Insufficient documentation

## 2021-05-01 DIAGNOSIS — Z01812 Encounter for preprocedural laboratory examination: Secondary | ICD-10-CM | POA: Insufficient documentation

## 2021-05-01 DIAGNOSIS — Z01818 Encounter for other preprocedural examination: Secondary | ICD-10-CM

## 2021-05-02 LAB — SARS CORONAVIRUS 2 (TAT 6-24 HRS): SARS Coronavirus 2: NEGATIVE

## 2021-05-02 NOTE — H&P (Signed)
TOTAL KNEE REVISION ADMISSION H&P  Patient is being admitted for left total knee arthroplasty revision.  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 The patient indicates she is ready for her knee to be fixed. She notes her knee has been giving out quite a bit on the lateral aspect of her knee since 2006.   The patient indicates she has been wearing a brace on her knee, which does not provide much relief. She notes she has a pinching sensation that radiates down the lateral aspect of her leg after 10 steps. She states she has been icing her knee.   The patient indicates her knee has been unstable since 2006. She notes her pain began to worsen approximately 4 years ago. She localizes her pain to the anterior, posterior, and lateral aspects of her knee. She states her pain is aggravated by weight-bearing. She notes her pain settles down once she gets off of her knee. The patient indicates her knee feels as if it does not want to go anywhere when she is ambulating without her brace. She states her patella is not going back very much.  We discussed the patient's presenting complaints, history, and treatment options. She has grossly unstable. TKA. I think the polyethylene has worn. She has had ligament reconstructions on that knee in the past, which makes her more prone to developing instability of the knee arthroplasty. She had it done 16 years ago and she is probably having some polyethylene wear, which has exacerbated the instability. Regardless, at this point, she is having significant functional issues with the knee. Because of the instability, the most predictable means of getting her better will be a total knee arthroplasty revision. We discussed the procedure risks, potential complications, and rehabilitation course in detail. At this time, the patient would like  to go ahead and proceed with left total knee arthroplasty revision. We will schedule this at the patient's earliest convenience.   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 Authorizing Provider  acetaminophen (TYLENOL) 325 MG tablet Take 650 mg by mouth every 6 (six) hours as needed for moderate pain. 11/18/14  Yes [provider]  Calcium Carb-Cholecalciferol 600-400 MG-UNIT TABS Take 1 tablet by mouth daily. 10/13/15  Yes  [provider]  gabapentin (NEURONTIN) 300 MG capsule Take 1 capsule (300 mg total) by mouth 3 (three) times daily. For back pain. Patient taking differently: Take 600 mg by mouth at bedtime. 07/27/20  Yes Pleas Koch, NP  meloxicam (MOBIC) 15 MG tablet Take 1 tablet (15 mg total) by mouth daily. Patient taking differently: Take 15 mg by mouth daily as needed for pain. 02/28/21  Yes Edrick Kins, DPM  Omega 3-6-9 Fatty Acids  (OMEGA 3-6-9 COMPLEX PO) Take 1 capsule by mouth daily.   Yes [provider]    Allergies  Allergen Reactions   Cipro [Ciprofloxacin-Ciproflox Hcl Er] Hives   Morphine And Related Nausea And Vomiting    Social History   Socioeconomic History   Marital status: Single    Spouse name: Not on file   Number of children: Not on file   Years of education: Not on file   Highest education level: Not on file  Occupational History   Not on file  Tobacco Use   Smoking status: Never   Smokeless tobacco: Never  Vaping Use   Vaping Use: Never used  Substance and Sexual Activity   Alcohol use: Yes    Comment: 2 per month   Drug use: Never   Sexual activity: Not on file  Other Topics Concern   Not on file  Social History Narrative   Not on file   Social Determinants of Health   Financial Resource Strain: Low Risk    Difficulty of Paying Living Expenses: Not hard at all  Food Insecurity: No Food Insecurity   Worried About Charity fundraiser in the Last Year: Never true   Ran Out of Food in the Last Year: Never true  Transportation Needs: No Transportation Needs   Lack of Transportation (Medical): No   Lack of Transportation (Non-Medical): No  Physical Activity: Sufficiently Active   Days of Exercise per Week: 3 days   Minutes of Exercise per Session: 60 min  Stress: Stress Concern Present   Feeling of Stress : To some extent  Social Connections: Not on file  Intimate Partner Violence: Not At Risk   Fear of Current or Ex-Partner: No   Emotionally Abused: No   Physically Abused: No   Sexually Abused: No    Tobacco Use: Low Risk    Smoking Tobacco Use: Never   Smokeless Tobacco Use: Never   Passive Exposure: Not on file   Social History   Substance and Sexual Activity  Alcohol Use Yes   Comment: 2 per month    Family History  Problem Relation Age of Onset   Breast cancer Mother 24    ROS: Constitutional: no fever, no chills, no night sweats, no  significant weight loss Cardiovascular: no chest pain, no palpitations Respiratory: no cough, no shortness of breath, No COPD Gastrointestinal: no vomiting, no nausea Musculoskeletal: no swelling in Joints, Joint Pain Neurologic: no numbness, no tingling, no difficulty with balance   Objective:  Physical Exam: Well nourished and well developed.  General: Alert and oriented x3, cooperative and pleasant, no acute distress.  Head: normocephalic, atraumatic, neck supple.  Eyes: EOMI.  Respiratory: breath sounds clear in all fields, no wheezing, rales, or rhonchi. Cardiovascular: Regular rate and rhythm, no murmurs, gallops or rubs.  Abdomen: non-tender to palpation and soft, normoactive bowel sounds. Musculoskeletal:  The patient has an antalgic gait pattern on the left.     Left knee exam:   There is no swelling.  No effusion.   The range of motion: 0 to 130 degrees.   Significant varus/valgus laxity in extension and AP laxity in flexion.  Calves soft and nontender. Motor function intact in LE. Strength 5/5 LE bilaterally. Neuro: Distal pulses 2+. Sensation to light touch intact in LE.    Vital signs in last 24 hours:    Imaging Review  Radiographs- AP and lateral of the bilateral knees dated 06/02/2020 demonstrate the prosthesis bilaterally with no evidence of any definitive periprosthetic loosening.   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 The risks and benefits of total knee arthroplasty revision were presented and reviewed. The risks due to aseptic loosening, infection, stiffness, patella tracking problems, thromboembolic complications and other imponderables were discussed. The patient acknowledged the explanation, agreed to proceed with the plan and  consent was signed. Patient is being admitted for inpatient treatment for surgery, pain control, PT, OT, prophylactic antibiotics, VTE prophylaxis, progressive ambulation and ADLs and discharge planning. The patient is planning to be discharged  home .    Therapy Plans: [facility] in [place] Disposition: Home with Husband Planned DVT Prophylaxis: Aspirin 325mg   DME Needed: [facility] PCP: Alma Friendly, NP (clearance received) Cardiology: Kate Sable, MD (clearance received - chronic Bradycardia) TXA: IV Allergies: Cipro, Morphine (dizziness, moderate - specific to morphine) Anesthesia Concerns:  BMI: 21.8 Last HgbA1c: N/A  Pharmacy: [facility] on [address] in [place]  - Patient was instructed on what medications to stop prior to surgery. - Follow-up visit in 2 weeks with Dr. Wynelle Link - Begin physical therapy following surgery - Pre-operative lab work as pre-surgical testing - Prescriptions will be provided in hospital at time of discharge  Fenton Foy, [facility], PA-C Orthopedic Surgery [facility] [place]

## 2021-05-03 ENCOUNTER — Encounter (HOSPITAL_COMMUNITY)
Admission: RE | Disposition: A | Discharge status: Home / Self Care | Payer: Self-pay | Source: Home / Self Care | Attending: Orthopedic Surgery

## 2021-05-03 ENCOUNTER — Inpatient Hospital Stay (HOSPITAL_COMMUNITY): Payer: Medicare Other | Admitting: Anesthesiology

## 2021-05-03 ENCOUNTER — Inpatient Hospital Stay (HOSPITAL_COMMUNITY)
Admission: RE | Admit: 2021-05-03 | Discharge: 2021-05-05 | DRG: 467 | Disposition: A | Discharge status: Home / Self Care | Payer: Medicare Other | Attending: Orthopedic Surgery | Admitting: Orthopedic Surgery

## 2021-05-03 ENCOUNTER — Encounter (HOSPITAL_COMMUNITY): Payer: Self-pay | Admitting: Orthopedic Surgery

## 2021-05-03 ENCOUNTER — Other Ambulatory Visit: Payer: Self-pay

## 2021-05-03 DIAGNOSIS — T84018A Broken internal joint prosthesis, other site, initial encounter: Secondary | ICD-10-CM

## 2021-05-03 DIAGNOSIS — T84063A Wear of articular bearing surface of internal prosthetic left knee joint, initial encounter: Secondary | ICD-10-CM | POA: Diagnosis present

## 2021-05-03 DIAGNOSIS — R001 Bradycardia, unspecified: Secondary | ICD-10-CM | POA: Diagnosis present

## 2021-05-03 DIAGNOSIS — G8929 Other chronic pain: Secondary | ICD-10-CM | POA: Diagnosis present

## 2021-05-03 DIAGNOSIS — Y792 Prosthetic and other implants, materials and accessory orthopedic devices associated with adverse incidents: Secondary | ICD-10-CM | POA: Diagnosis present

## 2021-05-03 DIAGNOSIS — M25262 Flail joint, left knee: Secondary | ICD-10-CM | POA: Diagnosis present

## 2021-05-03 DIAGNOSIS — Z791 Long term (current) use of non-steroidal anti-inflammatories (NSAID): Secondary | ICD-10-CM | POA: Diagnosis not present

## 2021-05-03 DIAGNOSIS — Z881 Allergy status to other antibiotic agents status: Secondary | ICD-10-CM | POA: Diagnosis not present

## 2021-05-03 DIAGNOSIS — E119 Type 2 diabetes mellitus without complications: Secondary | ICD-10-CM | POA: Diagnosis present

## 2021-05-03 DIAGNOSIS — F4323 Adjustment disorder with mixed anxiety and depressed mood: Secondary | ICD-10-CM | POA: Diagnosis present

## 2021-05-03 DIAGNOSIS — Z85828 Personal history of other malignant neoplasm of skin: Secondary | ICD-10-CM

## 2021-05-03 DIAGNOSIS — Z20822 Contact with and (suspected) exposure to covid-19: Secondary | ICD-10-CM | POA: Diagnosis present

## 2021-05-03 DIAGNOSIS — Z803 Family history of malignant neoplasm of breast: Secondary | ICD-10-CM

## 2021-05-03 DIAGNOSIS — Z885 Allergy status to narcotic agent status: Secondary | ICD-10-CM | POA: Diagnosis not present

## 2021-05-03 DIAGNOSIS — Z96652 Presence of left artificial knee joint: Secondary | ICD-10-CM

## 2021-05-03 DIAGNOSIS — K633 Ulcer of intestine: Secondary | ICD-10-CM | POA: Diagnosis present

## 2021-05-03 DIAGNOSIS — K769 Liver disease, unspecified: Secondary | ICD-10-CM | POA: Diagnosis present

## 2021-05-03 DIAGNOSIS — T84093A Other mechanical complication of internal left knee prosthesis, initial encounter: Secondary | ICD-10-CM | POA: Diagnosis present

## 2021-05-03 DIAGNOSIS — Z981 Arthrodesis status: Secondary | ICD-10-CM

## 2021-05-03 DIAGNOSIS — M810 Age-related osteoporosis without current pathological fracture: Secondary | ICD-10-CM | POA: Diagnosis present

## 2021-05-03 DIAGNOSIS — Z96659 Presence of unspecified artificial knee joint: Secondary | ICD-10-CM

## 2021-05-03 DIAGNOSIS — Z79899 Other long term (current) drug therapy: Secondary | ICD-10-CM | POA: Diagnosis not present

## 2021-05-03 HISTORY — DX: Broken internal joint prosthesis, other site, initial encounter: T84.018A

## 2021-05-03 HISTORY — PX: TOTAL KNEE REVISION: SHX996

## 2021-05-03 HISTORY — DX: Presence of left artificial knee joint: Z96.652

## 2021-05-03 LAB — TYPE AND SCREEN
ABO/RH(D): A NEG
Antibody Screen: NEGATIVE

## 2021-05-03 LAB — ABO/RH: ABO/RH(D): A NEG

## 2021-05-03 SURGERY — TOTAL KNEE REVISION
Anesthesia: Spinal | Site: Knee | Laterality: Left

## 2021-05-03 MED ORDER — HYDROMORPHONE HCL 1 MG/ML IJ SOLN
0.5000 mg | INTRAMUSCULAR | Status: DC | PRN
  Administered 2021-05-03: 1 mg via INTRAVENOUS
  Filled 2021-05-03: qty 1

## 2021-05-03 MED ORDER — HYDROMORPHONE HCL 1 MG/ML IJ SOLN
INTRAMUSCULAR | Status: AC
  Filled 2021-05-03: qty 1

## 2021-05-03 MED ORDER — ROPIVACAINE HCL 5 MG/ML IJ SOLN
INTRAMUSCULAR | Status: DC | PRN
Start: 2021-05-03 — End: 2021-05-03
  Administered 2021-05-03: 20 mL via PERINEURAL

## 2021-05-03 MED ORDER — BUPIVACAINE LIPOSOME 1.3 % IJ SUSP
INTRAMUSCULAR | Status: DC | PRN
  Administered 2021-05-03: 20 mL

## 2021-05-03 MED ORDER — CEFAZOLIN SODIUM-DEXTROSE 2-4 GM/100ML-% IV SOLN
2.0000 g | INTRAVENOUS | Status: AC
  Administered 2021-05-03: 2 g via INTRAVENOUS
  Filled 2021-05-03: qty 100

## 2021-05-03 MED ORDER — PHENYLEPHRINE HCL (PRESSORS) 10 MG/ML IV SOLN
INTRAVENOUS | Status: AC
  Filled 2021-05-03: qty 1

## 2021-05-03 MED ORDER — METOCLOPRAMIDE HCL 5 MG/ML IJ SOLN: 5.0000 mg | Freq: Three times a day (TID) | INTRAMUSCULAR | Status: DC | PRN

## 2021-05-03 MED ORDER — STERILE WATER FOR IRRIGATION IR SOLN
Status: DC | PRN
  Administered 2021-05-03: 2000 mL

## 2021-05-03 MED ORDER — 0.9 % SODIUM CHLORIDE (POUR BTL) OPTIME
TOPICAL | Status: DC | PRN
Start: 2021-05-03 — End: 2021-05-03
  Administered 2021-05-03: 1000 mL

## 2021-05-03 MED ORDER — ACETAMINOPHEN 10 MG/ML IV SOLN
1000.0000 mg | Freq: Four times a day (QID) | INTRAVENOUS | Status: DC
  Administered 2021-05-03: 1000 mg via INTRAVENOUS
  Filled 2021-05-03: qty 100

## 2021-05-03 MED ORDER — ONDANSETRON HCL 4 MG/2ML IJ SOLN
4.0000 mg | Freq: Four times a day (QID) | INTRAMUSCULAR | Status: DC | PRN
  Administered 2021-05-03 – 2021-05-05 (×3): 4 mg via INTRAVENOUS
  Filled 2021-05-03 (×3): qty 2

## 2021-05-03 MED ORDER — BUPIVACAINE LIPOSOME 1.3 % IJ SUSP: 20.0000 mL | Freq: Once | INTRAMUSCULAR | Status: DC

## 2021-05-03 MED ORDER — ACETAMINOPHEN 10 MG/ML IV SOLN: 1000.0000 mg | Freq: Once | INTRAVENOUS | Status: DC | PRN

## 2021-05-03 MED ORDER — MIDAZOLAM HCL 2 MG/2ML IJ SOLN
1.0000 mg | INTRAMUSCULAR | Status: DC
  Administered 2021-05-03: 1 mg via INTRAVENOUS
  Filled 2021-05-03: qty 2

## 2021-05-03 MED ORDER — SODIUM CHLORIDE 0.9 % IR SOLN
Status: DC | PRN
  Administered 2021-05-03: 1000 mL

## 2021-05-03 MED ORDER — DOCUSATE SODIUM 100 MG PO CAPS
100.0000 mg | ORAL_CAPSULE | Freq: Two times a day (BID) | ORAL | Status: DC
  Administered 2021-05-04 – 2021-05-05 (×4): 100 mg via ORAL
  Filled 2021-05-03 (×4): qty 1

## 2021-05-03 MED ORDER — PROPOFOL 500 MG/50ML IV EMUL
INTRAVENOUS | Status: DC | PRN
Start: 2021-05-03 — End: 2021-05-03
  Administered 2021-05-03: 100 ug/kg/min via INTRAVENOUS

## 2021-05-03 MED ORDER — OXYCODONE HCL 5 MG PO TABS
5.0000 mg | ORAL_TABLET | ORAL | Status: DC | PRN
  Filled 2021-05-03: qty 2

## 2021-05-03 MED ORDER — LACTATED RINGERS IV SOLN: INTRAVENOUS | Status: DC

## 2021-05-03 MED ORDER — ASPIRIN EC 325 MG PO TBEC
325.0000 mg | DELAYED_RELEASE_TABLET | Freq: Two times a day (BID) | ORAL | Status: DC
  Administered 2021-05-04 – 2021-05-05 (×3): 325 mg via ORAL
  Filled 2021-05-03 (×3): qty 1

## 2021-05-03 MED ORDER — GABAPENTIN 300 MG PO CAPS
300.0000 mg | ORAL_CAPSULE | Freq: Three times a day (TID) | ORAL | Status: DC
  Administered 2021-05-04 – 2021-05-05 (×3): 300 mg via ORAL
  Filled 2021-05-03 (×3): qty 1

## 2021-05-03 MED ORDER — TRANEXAMIC ACID-NACL 1000-0.7 MG/100ML-% IV SOLN
1000.0000 mg | INTRAVENOUS | Status: AC
  Administered 2021-05-03: 1000 mg via INTRAVENOUS
  Filled 2021-05-03: qty 100

## 2021-05-03 MED ORDER — PHENYLEPHRINE HCL-NACL 20-0.9 MG/250ML-% IV SOLN
INTRAVENOUS | Status: DC | PRN
  Administered 2021-05-03: 30 ug/min via INTRAVENOUS

## 2021-05-03 MED ORDER — LACTATED RINGERS IV SOLN
INTRAVENOUS | Status: DC
Start: 2021-05-03 — End: 2021-05-03

## 2021-05-03 MED ORDER — BUPIVACAINE IN DEXTROSE 0.75-8.25 % IT SOLN
INTRATHECAL | Status: DC | PRN
Start: 2021-05-03 — End: 2021-05-03
  Administered 2021-05-03: 1.6 mL via INTRATHECAL

## 2021-05-03 MED ORDER — SODIUM CHLORIDE (PF) 0.9 % IJ SOLN
INTRAMUSCULAR | Status: AC
  Filled 2021-05-03: qty 10

## 2021-05-03 MED ORDER — HYDROMORPHONE HCL 2 MG/ML IJ SOLN
INTRAMUSCULAR | Status: AC
  Filled 2021-05-03: qty 1

## 2021-05-03 MED ORDER — MIDAZOLAM HCL 2 MG/2ML IJ SOLN
INTRAMUSCULAR | Status: AC
  Filled 2021-05-03: qty 2

## 2021-05-03 MED ORDER — POLYETHYLENE GLYCOL 3350 17 G PO PACK: 17.0000 g | PACK | Freq: Every day | ORAL | Status: DC | PRN

## 2021-05-03 MED ORDER — FENTANYL CITRATE PF 50 MCG/ML IJ SOSY
50.0000 ug | PREFILLED_SYRINGE | INTRAMUSCULAR | Status: DC
  Administered 2021-05-03: 50 ug via INTRAVENOUS
  Filled 2021-05-03: qty 2

## 2021-05-03 MED ORDER — BUPIVACAINE LIPOSOME 1.3 % IJ SUSP
INTRAMUSCULAR | Status: AC
  Filled 2021-05-03: qty 20

## 2021-05-03 MED ORDER — ONDANSETRON HCL 4 MG/2ML IJ SOLN: 4.0000 mg | Freq: Once | INTRAMUSCULAR | Status: DC | PRN

## 2021-05-03 MED ORDER — CEFAZOLIN SODIUM-DEXTROSE 2-4 GM/100ML-% IV SOLN
2.0000 g | Freq: Four times a day (QID) | INTRAVENOUS | Status: AC
  Administered 2021-05-03 – 2021-05-04 (×2): 2 g via INTRAVENOUS
  Filled 2021-05-03 (×2): qty 100

## 2021-05-03 MED ORDER — SODIUM CHLORIDE (PF) 0.9 % IJ SOLN
INTRAMUSCULAR | Status: DC | PRN
  Administered 2021-05-03: 60 mL

## 2021-05-03 MED ORDER — PHENOL 1.4 % MT LIQD: 1.0000 | OROMUCOSAL | Status: DC | PRN

## 2021-05-03 MED ORDER — DEXAMETHASONE SODIUM PHOSPHATE 10 MG/ML IJ SOLN
8.0000 mg | Freq: Once | INTRAMUSCULAR | Status: AC
  Administered 2021-05-03: 6 mg via INTRAVENOUS

## 2021-05-03 MED ORDER — FLEET ENEMA 7-19 GM/118ML RE ENEM: 1.0000 | ENEMA | Freq: Once | RECTAL | Status: DC | PRN

## 2021-05-03 MED ORDER — EPHEDRINE SULFATE-NACL 50-0.9 MG/10ML-% IV SOSY
PREFILLED_SYRINGE | INTRAVENOUS | Status: DC | PRN
  Administered 2021-05-03: 5 mg via INTRAVENOUS

## 2021-05-03 MED ORDER — PROPOFOL 10 MG/ML IV BOLUS
INTRAVENOUS | Status: DC | PRN
  Administered 2021-05-03: 20 mg via INTRAVENOUS
  Administered 2021-05-03 (×2): 10 mg via INTRAVENOUS
  Administered 2021-05-03 (×2): 20 mg via INTRAVENOUS

## 2021-05-03 MED ORDER — BISACODYL 10 MG RE SUPP: 10.0000 mg | Freq: Every day | RECTAL | Status: DC | PRN

## 2021-05-03 MED ORDER — DEXAMETHASONE SODIUM PHOSPHATE 10 MG/ML IJ SOLN
10.0000 mg | Freq: Once | INTRAMUSCULAR | Status: AC
  Administered 2021-05-04: 10 mg via INTRAVENOUS
  Filled 2021-05-03: qty 1

## 2021-05-03 MED ORDER — MIDAZOLAM HCL 2 MG/2ML IJ SOLN
INTRAMUSCULAR | Status: DC | PRN
  Administered 2021-05-03: .5 mg via INTRAVENOUS

## 2021-05-03 MED ORDER — FENTANYL CITRATE (PF) 100 MCG/2ML IJ SOLN
INTRAMUSCULAR | Status: AC
  Filled 2021-05-03: qty 2

## 2021-05-03 MED ORDER — METOCLOPRAMIDE HCL 5 MG PO TABS
5.0000 mg | ORAL_TABLET | Freq: Three times a day (TID) | ORAL | Status: DC | PRN
  Administered 2021-05-04: 5 mg via ORAL
  Filled 2021-05-03: qty 1

## 2021-05-03 MED ORDER — MENTHOL 3 MG MT LOZG: 1.0000 | LOZENGE | OROMUCOSAL | Status: DC | PRN

## 2021-05-03 MED ORDER — ORAL CARE MOUTH RINSE: 15.0000 mL | Freq: Once | OROMUCOSAL | Status: DC

## 2021-05-03 MED ORDER — CHLORHEXIDINE GLUCONATE 0.12 % MT SOLN: 15.0000 mL | Freq: Once | OROMUCOSAL | Status: DC

## 2021-05-03 MED ORDER — ONDANSETRON HCL 4 MG PO TABS: 4.0000 mg | ORAL_TABLET | Freq: Four times a day (QID) | ORAL | Status: DC | PRN

## 2021-05-03 MED ORDER — POVIDONE-IODINE 10 % EX SWAB
2.0000 | Freq: Once | CUTANEOUS | Status: DC
Start: 2021-05-03 — End: 2021-05-03

## 2021-05-03 MED ORDER — DIPHENHYDRAMINE HCL 12.5 MG/5ML PO ELIX: 12.5000 mg | ORAL_SOLUTION | ORAL | Status: DC | PRN

## 2021-05-03 MED ORDER — LACTATED RINGERS IV SOLN: INTRAVENOUS | Status: DC | PRN

## 2021-05-03 MED ORDER — METHOCARBAMOL 500 MG PO TABS
500.0000 mg | ORAL_TABLET | Freq: Four times a day (QID) | ORAL | Status: DC | PRN
  Administered 2021-05-04 (×2): 500 mg via ORAL
  Filled 2021-05-03 (×2): qty 1

## 2021-05-03 MED ORDER — HYDROMORPHONE HCL 1 MG/ML IJ SOLN
INTRAMUSCULAR | Status: DC | PRN
Start: 2021-05-03 — End: 2021-05-03
  Administered 2021-05-03 (×5): .4 mg via INTRAVENOUS

## 2021-05-03 MED ORDER — OXYCODONE HCL 5 MG PO TABS
10.0000 mg | ORAL_TABLET | ORAL | Status: DC | PRN
  Administered 2021-05-04: 10 mg via ORAL

## 2021-05-03 MED ORDER — ACETAMINOPHEN 325 MG PO TABS
325.0000 mg | ORAL_TABLET | Freq: Four times a day (QID) | ORAL | Status: DC | PRN
  Administered 2021-05-04 – 2021-05-05 (×3): 650 mg via ORAL
  Filled 2021-05-03 (×3): qty 2

## 2021-05-03 MED ORDER — TRAMADOL HCL 50 MG PO TABS
50.0000 mg | ORAL_TABLET | Freq: Four times a day (QID) | ORAL | Status: DC | PRN
  Administered 2021-05-04: 100 mg via ORAL
  Filled 2021-05-03: qty 2

## 2021-05-03 MED ORDER — ONDANSETRON HCL 4 MG/2ML IJ SOLN
INTRAMUSCULAR | Status: DC | PRN
  Administered 2021-05-03: 4 mg via INTRAVENOUS

## 2021-05-03 MED ORDER — METHOCARBAMOL 500 MG IVPB - SIMPLE MED
500.0000 mg | Freq: Four times a day (QID) | INTRAVENOUS | Status: DC | PRN
  Filled 2021-05-03: qty 50

## 2021-05-03 MED ORDER — HYDROMORPHONE HCL 1 MG/ML IJ SOLN
0.2500 mg | INTRAMUSCULAR | Status: DC | PRN
  Administered 2021-05-03: 0.25 mg via INTRAVENOUS

## 2021-05-03 MED ORDER — SODIUM CHLORIDE 0.9 % IV SOLN: INTRAVENOUS | Status: DC

## 2021-05-03 SURGICAL SUPPLY — 72 items
ATTUNE DIST FEM SZ5 4 KNEE (Miscellaneous) ×2 IMPLANT
ATUNE CEMENTED STEM 16X80 (Stem) ×2 IMPLANT
AUG TIB ATTUNE UNV SZ5 6X5 (Joint) ×4 IMPLANT
AUGMENT POST FEM SZ5 4 KNEE (Miscellaneous) ×2 IMPLANT
AUGMENT TIB ATTUNE UNV SZ5 6X5 (Joint) IMPLANT
BAG COUNTER SPONGE SURGICOUNT (BAG) IMPLANT
BAG DECANTER FOR FLEXI CONT (MISCELLANEOUS) ×2 IMPLANT
BAG ZIPLOCK 12X15 (MISCELLANEOUS) IMPLANT
BLADE SAG 18X100X1.27 (BLADE) ×2 IMPLANT
BLADE SAW SGTL 11.0X1.19X90.0M (BLADE) ×2 IMPLANT
BLADE SURG SZ10 CARB STEEL (BLADE) ×4 IMPLANT
BNDG ELASTIC 6X5.8 VLCR STR LF (GAUZE/BANDAGES/DRESSINGS) ×2 IMPLANT
BONE CEMENT GENTAMICIN (Cement) ×6 IMPLANT
CEMENT BONE GENTAMICIN 40 (Cement) ×3 IMPLANT
CEMENT RESTRICTOR DEPUY SZ 6 (Cement) ×1 IMPLANT
CLOTH BEACON ORANGE TIMEOUT ST (SAFETY) ×2 IMPLANT
COMP FEM ATTUNE CRS SZ5 LT (Femur) ×2 IMPLANT
COMPONENT FEM ATN CRS SZ5 LT (Femur) IMPLANT
COVER SURGICAL LIGHT HANDLE (MISCELLANEOUS) ×2 IMPLANT
CUFF TOURN SGL QUICK 34 (TOURNIQUET CUFF) ×1
CUFF TRNQT CYL 34X4.125X (TOURNIQUET CUFF) ×1 IMPLANT
DECANTER SPIKE VIAL GLASS SM (MISCELLANEOUS) IMPLANT
DRAPE INCISE IOBAN 66X45 STRL (DRAPES) ×2 IMPLANT
DRAPE U-SHAPE 47X51 STRL (DRAPES) ×2 IMPLANT
DRESSING AQUACEL AG SP 3.5X10 (GAUZE/BANDAGES/DRESSINGS) IMPLANT
DRSG ADAPTIC 3X8 NADH LF (GAUZE/BANDAGES/DRESSINGS) ×2 IMPLANT
DRSG AQUACEL AG SP 3.5X10 (GAUZE/BANDAGES/DRESSINGS) ×2
DRSG PAD ABDOMINAL 8X10 ST (GAUZE/BANDAGES/DRESSINGS) ×2 IMPLANT
DURAPREP 26ML APPLICATOR (WOUND CARE) ×2 IMPLANT
ELECT REM PT RETURN 15FT ADLT (MISCELLANEOUS) ×2 IMPLANT
EVACUATOR 1/8 PVC DRAIN (DRAIN) ×2 IMPLANT
GAUZE SPONGE 4X4 12PLY STRL (GAUZE/BANDAGES/DRESSINGS) ×2 IMPLANT
GLOVE SRG 8 PF TXTR STRL LF DI (GLOVE) ×1 IMPLANT
GLOVE SURG ENC MOIS LTX SZ6.5 (GLOVE) ×4 IMPLANT
GLOVE SURG ENC MOIS LTX SZ8 (GLOVE) ×4 IMPLANT
GLOVE SURG UNDER POLY LF SZ7 (GLOVE) ×2 IMPLANT
GLOVE SURG UNDER POLY LF SZ8 (GLOVE) ×1
GLOVE SURG UNDER POLY LF SZ8.5 (GLOVE) IMPLANT
GOWN STRL REUS W/TWL LRG LVL3 (GOWN DISPOSABLE) ×4 IMPLANT
HANDPIECE INTERPULSE COAX TIP (DISPOSABLE) ×1
HOLDER FOLEY CATH W/STRAP (MISCELLANEOUS) IMPLANT
IMMOBILIZER KNEE 20 (SOFTGOODS) ×2
IMMOBILIZER KNEE 20 THIGH 36 (SOFTGOODS) ×1 IMPLANT
INSERT TIB CRS ATTUNE SZ5 16 (Insert) ×1 IMPLANT
KIT TURNOVER KIT A (KITS) IMPLANT
MANIFOLD NEPTUNE II (INSTRUMENTS) ×2 IMPLANT
NS IRRIG 1000ML POUR BTL (IV SOLUTION) ×2 IMPLANT
PACK TOTAL KNEE CUSTOM (KITS) ×2 IMPLANT
PADDING CAST COTTON 6X4 STRL (CAST SUPPLIES) ×3 IMPLANT
PLATE REV TIB BAS ROT SZ5 KNEE (Orthopedic Implant) IMPLANT
PROTECTOR NERVE ULNAR (MISCELLANEOUS) ×2 IMPLANT
REV TIB BASE ROT PLAT SZ5 KNEE (Orthopedic Implant) ×2 IMPLANT
SET HNDPC FAN SPRY TIP SCT (DISPOSABLE) ×1 IMPLANT
SLEEVE TIB CMT ATTUNE 29 (Sleeve) ×1 IMPLANT
SPONGE T-LAP 18X18 ~~LOC~~+RFID (SPONGE) ×6 IMPLANT
STEM ATTUNE CEMENTED 16X80 (Stem) IMPLANT
STEM STR ATTUNE PF 14X110 (Knees) ×1 IMPLANT
STRIP CLOSURE SKIN 1/2X4 (GAUZE/BANDAGES/DRESSINGS) ×3 IMPLANT
SUT MNCRL AB 4-0 PS2 18 (SUTURE) ×2 IMPLANT
SUT STRATAFIX 0 PDS 27 VIOLET (SUTURE) ×2
SUT VIC AB 2-0 CT1 27 (SUTURE) ×3
SUT VIC AB 2-0 CT1 TAPERPNT 27 (SUTURE) ×3 IMPLANT
SUTURE STRATFX 0 PDS 27 VIOLET (SUTURE) ×1 IMPLANT
SWAB COLLECTION DEVICE MRSA (MISCELLANEOUS) IMPLANT
SWAB CULTURE ESWAB REG 1ML (MISCELLANEOUS) IMPLANT
SYR 50ML LL SCALE MARK (SYRINGE) ×4 IMPLANT
TOWER CARTRIDGE SMART MIX (DISPOSABLE) ×2 IMPLANT
TRAY FOLEY MTR SLVR 16FR STAT (SET/KITS/TRAYS/PACK) ×2 IMPLANT
TUBE KAMVAC SUCTION (TUBING) IMPLANT
TUBE SUCTION HIGH CAP CLEAR NV (SUCTIONS) ×2 IMPLANT
WATER STERILE IRR 1000ML POUR (IV SOLUTION) ×2 IMPLANT
WRAP KNEE MAXI GEL POST OP (GAUZE/BANDAGES/DRESSINGS) ×1 IMPLANT

## 2021-05-03 NOTE — Anesthesia Preprocedure Evaluation (Signed)
Anesthesia Evaluation  Patient identified by MRN, date of birth, ID band Patient awake    Reviewed: Allergy & Precautions, NPO status , Patient's Chart, lab work & pertinent test results  Airway Mallampati: II  TM Distance: >3 FB Neck ROM: Full    Dental no notable dental hx.    Pulmonary neg pulmonary ROS,    Pulmonary exam normal breath sounds clear to auscultation       Cardiovascular negative cardio ROS Normal cardiovascular exam Rhythm:Regular Rate:Normal     Neuro/Psych negative neurological ROS  negative psych ROS   GI/Hepatic Neg liver ROS,   Endo/Other  negative endocrine ROS  Renal/GU negative Renal ROS  negative genitourinary   Musculoskeletal negative musculoskeletal ROS (+)   Abdominal   Peds negative pediatric ROS (+)  Hematology negative hematology ROS (+)   Anesthesia Other Findings   Reproductive/Obstetrics negative OB ROS                             Anesthesia Physical Anesthesia Plan  ASA: 2  Anesthesia Plan: Spinal   Post-op Pain Management: Regional block   Induction: Intravenous  PONV Risk Score and Plan: 2 and Ondansetron, Dexamethasone, Propofol infusion and Treatment may vary due to age or medical condition  Airway Management Planned: Simple Face Mask  Additional Equipment:   Intra-op Plan:   Post-operative Plan:   Informed Consent: I have reviewed the patients History and Physical, chart, labs and discussed the procedure including the risks, benefits and alternatives for the proposed anesthesia with the patient or authorized representative who has indicated his/her understanding and acceptance.     Dental advisory given  Plan Discussed with: CRNA and Surgeon  Anesthesia Plan Comments:         Anesthesia Quick Evaluation

## 2021-05-03 NOTE — Interval H&P Note (Signed)
History and Physical Interval Note:  05/03/2021 12:34 PM  Bonnie Ramsey  has presented today for surgery, with the diagnosis of Failed left total knee arthroplasty.  The various methods of treatment have been discussed with the patient and family. After consideration of risks, benefits and other options for treatment, the patient has consented to  Procedure(s): TOTAL KNEE REVISION (Left) as a surgical intervention.  The patient's history has been reviewed, patient examined, no change in status, stable for surgery.  I have reviewed the patient's chart and labs.  Questions were answered to the patient's satisfaction.     [name] [name]

## 2021-05-03 NOTE — Discharge Instructions (Signed)
 Bonnie Aluisio, MD Total Joint Specialist [facility] [place] [address] ,  [address] [phone]  TOTAL KNEE REVISION POSTOPERATIVE DIRECTIONS    Knee Rehabilitation, Guidelines Following Surgery  Results after knee surgery are often greatly improved when you follow the exercise, range of motion and muscle strengthening exercises prescribed by your doctor. Safety measures are also important to protect the knee from further injury. If any of these exercises cause you to have increased pain or swelling in your knee joint, decrease the amount until you are comfortable again and slowly increase them. If you have problems or questions, call your caregiver or physical therapist for advice.   BLOOD CLOT PREVENTION Take a 325 mg Aspirin two times a day for three weeks following surgery. Then take an 81 mg Aspirin once a day for three weeks. Then discontinue Aspirin. You may resume your vitamins/supplements upon discharge from the hospital. Do not take any NSAIDs (Advil, Aleve, Ibuprofen, Meloxicam, etc.) until you have discontinued the 325 mg Aspirin.   HOME CARE INSTRUCTIONS  Remove items at home which could result in a fall. This includes throw rugs or furniture in walking pathways.  ICE to the affected knee as much as tolerated. Icing helps control swelling. If the swelling is well controlled you will be more comfortable and rehab easier. Continue to use ice on the knee for pain and swelling from surgery. You may notice swelling that will progress down to the foot and ankle. This is normal after surgery. Elevate the leg when you are not up walking on it.    Continue to use the breathing machine which will help keep your temperature down. It is common for your temperature to cycle up and down following surgery, especially at night when you are not up moving around and exerting yourself. The breathing machine keeps your lungs expanded and your temperature  down. Do not place pillow under the operative knee, focus on keeping the knee straight while resting  DIET You may resume your previous home diet once you are discharged from the hospital.  DRESSING / WOUND CARE / SHOWERING Keep your bulky bandage on for 2 days. On the third post-operative day you may remove the Ace bandage and gauze. There is a waterproof adhesive bandage on your skin which will stay in place until your first follow-up appointment. Once you remove this you will not need to place another bandage You may begin showering 3 days following surgery, but do not submerge the incision under water.  ACTIVITY For the first 5 days, the key is rest and control of pain and swelling Do your home exercises twice a day starting on post-operative day 3. On the days you go to physical therapy, just do the home exercises once that day. You should rest, ice and elevate the leg for 50 minutes out of every hour. Get up and walk/stretch for 10 minutes per hour. After 5 days you can increase your activity slowly as tolerated. Walk with your walker as instructed. Use the walker until you are comfortable transitioning to a cane. Walk with the cane in the opposite hand of the operative leg. You may discontinue the cane once you are comfortable and walking steadily. Avoid periods of inactivity such as sitting longer than an hour when not asleep. This helps prevent blood clots.  You may discontinue the knee immobilizer once you are able to perform a straight leg raise while lying down. You may resume a sexual relationship in one   month or when given the OK by your doctor.  You may return to work once you are cleared by your doctor.  Do not drive a car for 6 weeks or until released by your surgeon.  Do not drive while taking narcotics.  TED HOSE STOCKINGS Wear the elastic stockings on both legs for three weeks following surgery during the day. You may remove them at night for sleeping.  WEIGHT  BEARING Weight bearing as tolerated with assist device (walker, cane, etc) as directed, use it as long as suggested by your surgeon or therapist, typically at least 4-6 weeks.  POSTOPERATIVE CONSTIPATION PROTOCOL Constipation - defined medically as fewer than three stools per week and severe constipation as less than one stool per week.  One of the most common issues patients have following surgery is constipation.  Even if you have a regular bowel pattern at home, your normal regimen is likely to be disrupted due to multiple reasons following surgery.  Combination of anesthesia, postoperative narcotics, change in appetite and fluid intake all can affect your bowels.  In order to avoid complications following surgery, here are some recommendations in order to help you during your recovery period.  Colace (docusate) - Pick up an over-the-counter form of Colace or another stool softener and take twice a day as long as you are requiring postoperative pain medications.  Take with a full glass of water daily.  If you experience loose stools or diarrhea, hold the colace until you stool forms back up. If your symptoms do not get better within 1 week or if they get worse, check with your doctor. Dulcolax (bisacodyl) - Pick up over-the-counter and take as directed by the product packaging as needed to assist with the movement of your bowels.  Take with a full glass of water.  Use this product as needed if not relieved by Colace only.  MiraLax (polyethylene glycol) - Pick up over-the-counter to have on hand. MiraLax is a solution that will increase the amount of water in your bowels to assist with bowel movements.  Take as directed and can mix with a glass of water, juice, soda, coffee, or tea. Take if you go more than two days without a movement. Do not use MiraLax more than once per day. Call your doctor if you are still constipated or irregular after using this medication for 7 days in a row.  If you continue  to have problems with postoperative constipation, please contact the office for further assistance and recommendations.  If you experience "the worst abdominal pain ever" or develop nausea or vomiting, please contact the office immediatly for further recommendations for treatment.  ITCHING If you experience itching with your medications, try taking only a single pain pill, or even half a pain pill at a time.  You can also use Benadryl over the counter for itching or also to help with sleep.   MEDICATIONS See your medication summary on the "After Visit Summary" that the nursing staff will review with you prior to discharge.  You may have some home medications which will be placed on hold until you complete the course of blood thinner medication.  It is important for you to complete the blood thinner medication as prescribed by your surgeon.  Continue your approved medications as instructed at time of discharge.  PRECAUTIONS If you experience chest pain or shortness of breath - call 911 immediately for transfer to the hospital emergency department.  If you develop a fever greater that 101   F, purulent drainage from wound, increased redness or drainage from wound, foul odor from the wound/dressing, or calf pain - CONTACT YOUR SURGEON.                                                   FOLLOW-UP APPOINTMENTS Make sure you keep all of your appointments after your operation with your surgeon and caregivers. You should call the office at the above phone number and make an appointment for approximately two weeks after the date of your surgery or on the date instructed by your surgeon outlined in the "After Visit Summary".  RANGE OF MOTION AND STRENGTHENING EXERCISES  Rehabilitation of the knee is important following a knee injury or an operation. After just a few days of immobilization, the muscles of the thigh which control the knee become weakened and shrink (atrophy). Knee exercises are designed to build up  the tone and strength of the thigh muscles and to improve knee motion. Often times heat used for twenty to thirty minutes before working out will loosen up your tissues and help with improving the range of motion but do not use heat for the first two weeks following surgery. These exercises can be done on a training (exercise) mat, on the floor, on a table or on a bed. Use what ever works the best and is most comfortable for you Knee exercises include:  Leg Lifts - While your knee is still immobilized in a splint or cast, you can do straight leg raises. Lift the leg to 60 degrees, hold for 3 sec, and slowly lower the leg. Repeat 10-20 times 2-3 times daily. Perform this exercise against resistance later as your knee gets better.  Quad and Hamstring Sets - Tighten up the muscle on the front of the thigh (Quad) and hold for 5-10 sec. Repeat this 10-20 times hourly. Hamstring sets are done by pushing the foot backward against an object and holding for 5-10 sec. Repeat as with quad sets.  Leg Slides: Lying on your back, slowly slide your foot toward your buttocks, bending your knee up off the floor (only go as far as is comfortable). Then slowly slide your foot back down until your leg is flat on the floor again. Angel Wings: Lying on your back spread your legs to the side as far apart as you can without causing discomfort.  A rehabilitation program following serious knee injuries can speed recovery and prevent re-injury in the future due to weakened muscles. Contact your doctor or a physical therapist for more information on knee rehabilitation.   POST-OPERATIVE OPIOID TAPER INSTRUCTIONS: It is important to wean off of your opioid medication as soon as possible. If you do not need pain medication after your surgery it is ok to stop day one. Opioids include: Codeine, Hydrocodone(Norco, Vicodin), Oxycodone(Percocet, oxycontin) and hydromorphone amongst others.  Long term and even short term use of opiods can  cause: Increased pain response Dependence Constipation Depression Respiratory depression And more.  Withdrawal symptoms can include Flu like symptoms Nausea, vomiting And more Techniques to manage these symptoms Hydrate well Eat regular healthy meals Stay active Use relaxation techniques(deep breathing, meditating, yoga) Do Not substitute Alcohol to help with tapering If you have been on opioids for less than two weeks and do not have pain than it is ok to stop all together.    Plan to wean off of opioids This plan should start within one week post op of your joint replacement. Maintain the same interval or time between taking each dose and first decrease the dose.  Cut the total daily intake of opioids by one tablet each day Next start to increase the time between doses. The last dose that should be eliminated is the evening dose.   IF YOU ARE TRANSFERRED TO A SKILLED REHAB FACILITY If the patient is transferred to a skilled rehab facility following release from the hospital, a list of the current medications will be sent to the facility for the patient to continue.  When discharged from the skilled rehab facility, please have the facility set up the patient's Home Health Physical Therapy prior to being released. Also, the skilled facility will be responsible for providing the patient with their medications at time of release from the facility to include their pain medication, the muscle relaxants, and their blood thinner medication. If the patient is still at the rehab facility at time of the two week follow up appointment, the skilled rehab facility will also need to assist the patient in arranging follow up appointment in our office and any transportation needs.  MAKE SURE YOU:  Understand these instructions.  Get help right away if you are not doing well or get worse.   DENTAL ANTIBIOTICS:  In most cases prophylactic antibiotics for Dental procdeures after total joint surgery are  not necessary.  Exceptions are as follows:  1. History of prior total joint infection  2. Severely immunocompromised (Organ Transplant, cancer chemotherapy, Rheumatoid biologic meds such as Humera)  3. Poorly controlled diabetes (A1C &gt; 8.0, blood glucose over 200)  If you have one of these conditions, contact your surgeon for an antibiotic prescription, prior to your dental procedure.    Pick up stool softner and laxative for home use following surgery while on pain medications. Do not submerge incision under water. Please use good hand washing techniques while changing dressing each day. May shower starting three days after surgery. Please use a clean towel to pat the incision dry following showers. Continue to use ice for pain and swelling after surgery. Do not use any lotions or creams on the incision until instructed by your surgeon.  

## 2021-05-03 NOTE — Progress Notes (Signed)
Orthopedic Tech Progress Note Patient Details:  Bonnie Ramsey October 01, 1950 [id number]  Patient ID: [name], female   DOB: February 17, 1951, 71 y.o.   MRN: [id number]  Kennis Carina 05/03/2021, 4:35 PM Cpm applied in pacu

## 2021-05-03 NOTE — Brief Op Note (Signed)
05/03/2021  3:44 PM  PATIENT:  Bonnie Ramsey  71 y.o. female  PRE-OPERATIVE DIAGNOSIS:  Failed left total knee arthroplasty  POST-OPERATIVE DIAGNOSIS:  Failed left total knee arthroplasty  PROCEDURE:  Procedure(s): TOTAL KNEE REVISION (Left)  SURGEON:  Surgeon(s) and Role:    Gaynelle Arabian, MD - Primary  PHYSICIAN ASSISTANT:   ASSISTANTS: Fenton Foy, PA-C   ANESTHESIA:   spinal and adductor canal block  EBL:  200 mL   BLOOD ADMINISTERED:none  DRAINS: none   LOCAL MEDICATIONS USED:  OTHER Exparel  COUNTS:  YES  TOURNIQUET:   Total Tourniquet Time Documented: Thigh (Left) - 69 minutes Total: Thigh (Left) - 69 minutes   DICTATION: .Other Dictation: Dictation Number [id number]  PLAN OF CARE: Admit to inpatient   PATIENT DISPOSITION:  PACU - hemodynamically stable.

## 2021-05-03 NOTE — Anesthesia Postprocedure Evaluation (Signed)
Anesthesia Post Note  Patient: Bonnie Ramsey  Procedure(s) Performed: TOTAL KNEE REVISION (Left: Knee)     Patient location during evaluation: PACU Anesthesia Type: Spinal Level of consciousness: oriented and awake and alert Pain management: pain level controlled Vital Signs Assessment: post-procedure vital signs reviewed and stable Respiratory status: spontaneous breathing, respiratory function stable and patient connected to nasal cannula oxygen Cardiovascular status: blood pressure returned to baseline and stable Postop Assessment: no headache, no backache and no apparent nausea or vomiting Anesthetic complications: no   No notable events documented.  Last Vitals:  Vitals:   05/03/21 1630 05/03/21 1645  BP: 108/78 110/77  Pulse: 66 68  Resp: 17 20  Temp:  (!) 36.4 C  SpO2: 96% 100%    Last Pain:  Vitals:   05/03/21 1645  TempSrc:   PainSc: 5     LLE Motor Response: Purposeful movement (05/03/21 1645) LLE Sensation: Decreased (05/03/21 1645) RLE Motor Response: Purposeful movement (05/03/21 1645) RLE Sensation: Decreased (05/03/21 1645) L Sensory Level: S1-Sole of foot, small toes (05/03/21 1645) R Sensory Level: S1-Sole of foot, small toes (05/03/21 1645)  Chamille Werntz S

## 2021-05-03 NOTE — Anesthesia Procedure Notes (Signed)
Procedure Name: MAC Date/Time: 05/03/2021 2:05 PM Performed by: Lieutenant Diego, CRNA Pre-anesthesia Checklist: Patient identified, Emergency Drugs available, Suction available, Patient being monitored and Timeout performed Patient Re-evaluated:Patient Re-evaluated prior to induction Oxygen Delivery Method: Simple face mask Preoxygenation: Pre-oxygenation with 100% oxygen Induction Type: IV induction

## 2021-05-03 NOTE — Transfer of Care (Signed)
Immediate Anesthesia Transfer of Care Note  Patient: Bonnie Ramsey  Procedure(s) Performed: TOTAL KNEE REVISION (Left: Knee)  Patient Location: PACU  Anesthesia Type:Spinal  Level of Consciousness: awake and alert   Airway & Oxygen Therapy: Patient Spontanous Breathing and Patient connected to face mask oxygen  Post-op Assessment: Report given to RN and Post -op Vital signs reviewed and stable  Post vital signs: Reviewed and stable  Last Vitals:  Vitals Value Taken Time  BP 114/83 05/03/21 1612  Temp    Pulse 70 05/03/21 1613  Resp 6 05/03/21 1613  SpO2 100 % 05/03/21 1613  Vitals shown include unvalidated device data.  Last Pain:  Vitals:   05/03/21 1057  TempSrc:   PainSc: 0-No pain      Patients Stated Pain Goal: 4 (45/91/36 8599)  Complications: No notable events documented.

## 2021-05-03 NOTE — Anesthesia Procedure Notes (Signed)
Spinal  Patient location during procedure: OR Start time: 05/03/2021 2:01 PM End time: 05/03/2021 2:05 PM Reason for block: surgical anesthesia Staffing Performed: anesthesiologist  Anesthesiologist: Myrtie Soman, MD Preanesthetic Checklist Completed: patient identified, IV checked, site marked, risks and benefits discussed, surgical consent, monitors and equipment checked, pre-op evaluation and timeout performed Spinal Block Patient position: sitting Prep: Betadine Patient monitoring: heart rate, continuous pulse ox and blood pressure Approach: midline Location: L3-4 Injection technique: single-shot Needle Needle type: Quincke  Needle gauge: 22 G Needle length: 9 cm Assessment Sensory level: T6 Events: CSF return Additional Notes

## 2021-05-03 NOTE — Plan of Care (Signed)
  Problem: Clinical Measurements: Goal: Ability to maintain clinical measurements within normal limits will improve Outcome: Progressing   Problem: Activity: Goal: Risk for activity intolerance will decrease Outcome: Progressing   Problem: Pain Managment: Goal: General experience of comfort will improve Outcome: Progressing   Problem: Safety: Goal: Ability to remain free from injury will improve Outcome: Progressing   

## 2021-05-03 NOTE — Anesthesia Procedure Notes (Signed)
Anesthesia Regional Block: Adductor canal block   Pre-Anesthetic Checklist: , timeout performed,  Correct Patient, Correct Site, Correct Laterality,  Correct Procedure, Correct Position, site marked,  Risks and benefits discussed,  Surgical consent,  Pre-op evaluation,  At surgeon's request and post-op pain management  Laterality: Left  Prep: chloraprep       Needles:  Injection technique: Single-shot  Needle Type: Echogenic Needle     Needle Length: 9cm      Additional Needles:   Procedures:,,,, ultrasound used (permanent image in chart),,    Narrative:  Start time: 05/03/2021 1:36 PM End time: 05/03/2021 1:42 PM Injection made incrementally with aspirations every 5 mL.  Performed by: Personally  Anesthesiologist: Myrtie Soman, MD  Additional Notes: Patient tolerated the procedure well without complications

## 2021-05-03 NOTE — Anesthesia Procedure Notes (Signed)
Anesthesia Procedure Image    

## 2021-05-03 NOTE — Progress Notes (Signed)
Orthopedic Tech Progress Note Patient Details:  Bonnie Ramsey 10-19-1950 [id number]  Patient ID: Bonnie Ramsey, female   DOB: 13-Apr-1951, 71 y.o.   MRN: [id number]  Bonnie Ramsey 05/03/2021, 8:16 PM Cpm removed from patient

## 2021-05-03 NOTE — Progress Notes (Signed)
Assisted Dr. Rose with left, ultrasound guided, adductor canal block. Side rails up, monitors on throughout procedure. See vital signs in flow sheet. Tolerated Procedure well.  

## 2021-05-04 ENCOUNTER — Other Ambulatory Visit (HOSPITAL_COMMUNITY): Payer: Self-pay

## 2021-05-04 LAB — BASIC METABOLIC PANEL
Anion gap: 5 (ref 5–15)
BUN: 14 mg/dL (ref 8–23)
CO2: 27 mmol/L (ref 22–32)
Calcium: 8.4 mg/dL — ABNORMAL LOW (ref 8.9–10.3)
Chloride: 103 mmol/L (ref 98–111)
Creatinine, Ser: 0.52 mg/dL (ref 0.44–1.00)
GFR, Estimated: >60 mL/min (ref >60)
Glucose, Bld: 124 mg/dL — ABNORMAL HIGH (ref 70–99)
Potassium: 4.2 mmol/L (ref 3.5–5.1)
Sodium: 135 mmol/L (ref 135–145)

## 2021-05-04 LAB — CBC
HCT: 31.3 % — ABNORMAL LOW (ref 36.0–46.0)
Hemoglobin: 10.3 g/dL — ABNORMAL LOW (ref 12.0–15.0)
MCH: 30.7 pg (ref 26.0–34.0)
MCHC: 32.9 g/dL (ref 30.0–36.0)
MCV: 93.4 fL (ref 80.0–100.0)
Platelets: 207 10*3/uL (ref 150–400)
RBC: 3.35 MIL/uL — ABNORMAL LOW (ref 3.87–5.11)
RDW: 13.4 % (ref 11.5–15.5)
WBC: 10.7 10*3/uL — ABNORMAL HIGH (ref 4.0–10.5)
nRBC: 0 % (ref 0.0–0.2)

## 2021-05-04 MED ORDER — TRAMADOL HCL 50 MG PO TABS
50.0000 mg | ORAL_TABLET | Freq: Four times a day (QID) | ORAL | 0 refills | Status: DC | PRN
  Filled 2021-05-04: qty 40, 5d supply, fill #0

## 2021-05-04 MED ORDER — OXYCODONE HCL 5 MG PO TABS
5.0000 mg | ORAL_TABLET | Freq: Four times a day (QID) | ORAL | 0 refills | Status: DC | PRN
Start: 2021-05-04 — End: 2021-05-04

## 2021-05-04 MED ORDER — TRAMADOL HCL 50 MG PO TABS: 50.0000 mg | ORAL_TABLET | Freq: Four times a day (QID) | ORAL | 0 refills | Status: DC | PRN

## 2021-05-04 MED ORDER — SODIUM CHLORIDE 0.9 % IV BOLUS
500.0000 mL | Freq: Once | INTRAVENOUS | Status: AC
  Administered 2021-05-04: 500 mL via INTRAVENOUS

## 2021-05-04 MED ORDER — OXYCODONE HCL 5 MG PO TABS
5.0000 mg | ORAL_TABLET | Freq: Four times a day (QID) | ORAL | 0 refills | Status: DC | PRN
  Filled 2021-05-04: qty 42, 6d supply, fill #0

## 2021-05-04 MED ORDER — ASPIRIN 325 MG PO TBEC
325.0000 mg | DELAYED_RELEASE_TABLET | Freq: Two times a day (BID) | ORAL | 0 refills | Status: DC
  Filled 2021-05-04: qty 40, 20d supply, fill #0

## 2021-05-04 MED ORDER — METHOCARBAMOL 500 MG PO TABS
500.0000 mg | ORAL_TABLET | Freq: Four times a day (QID) | ORAL | 0 refills | Status: DC | PRN
  Filled 2021-05-04: qty 40, 10d supply, fill #0

## 2021-05-04 NOTE — Progress Notes (Signed)
Physical Therapy Treatment Patient Details Name: Bonnie Ramsey MRN: [id number] DOB: 1950-07-01 Today's Date: 05/04/2021   History of Present Illness Pt is a 71 year old female s/p Left knee revision on 05/03/21    PT Comments    Pt ambulated in hallway and denies dizziness.  Pt also assisted to bathroom per request.  Pt provided with HEP handout and has gait belt for home.  Pt and spouse had no further questions, and pt feels ready for d/c home today.    Recommendations for follow up therapy are one component of a multi-disciplinary discharge planning process, led by the attending physician.  Recommendations may be updated based on patient status, additional functional criteria and insurance authorization.  Follow Up Recommendations  Follow physician's recommendations for discharge plan and follow up therapies     Assistance Recommended at Discharge PRN  Patient can return home with the following     Equipment Recommendations  Rolling walker (2 wheels)    Recommendations for Other Services       Precautions / Restrictions Precautions Precautions: Knee;Fall Restrictions Weight Bearing Restrictions: No Other Position/Activity Restrictions: WBAT     Mobility  Bed Mobility Overal bed mobility: Needs Assistance Bed Mobility: Supine to Sit     Supine to sit: Min guard, HOB elevated     General bed mobility comments: pt in recliner    Transfers Overall transfer level: Needs assistance Equipment used: Rolling walker (2 wheels) Transfers: Sit to/from Stand Sit to Stand: Min guard           General transfer comment: cues for UE and LE positioning    Ambulation/Gait Ambulation/Gait assistance: Min guard Gait Distance (Feet): 140 Feet Assistive device: Rolling walker (2 wheels) Gait Pattern/deviations: Step-to pattern, Decreased stance time - left, Antalgic       General Gait Details: verbal cues for sequence, step length, RW positioning, posture   Stairs              Wheelchair Mobility    Modified Rankin (Stroke Patients Only)       Balance                                            Cognition Arousal/Alertness: Awake/alert Behavior During Therapy: WFL for tasks assessed/performed Overall Cognitive Status: Within Functional Limits for tasks assessed                                          Exercises     General Comments        Pertinent Vitals/Pain Pain Assessment Pain Assessment: 0-10 Pain Score: 5  Pain Location: left thigh Pain Descriptors / Indicators: Sore Pain Intervention(s): Repositioned, Monitored during session    Home Living                          Prior Function            PT Goals (current goals can now be found in the care plan section) Acute Rehab PT Goals PT Goal Formulation: With patient Time For Goal Achievement: 05/10/21 Potential to Achieve Goals: Good Progress towards PT goals: Progressing toward goals    Frequency    7X/week      PT Plan Current plan remains  appropriate    Co-evaluation              AM-PAC PT "6 Clicks" Mobility   Outcome Measure  Help needed turning from your back to your side while in a flat bed without using bedrails?: A Little Help needed moving from lying on your back to sitting on the side of a flat bed without using bedrails?: A Little Help needed moving to and from a bed to a chair (including a wheelchair)?: A Little Help needed standing up from a chair using your arms (e.g., wheelchair or bedside chair)?: A Little Help needed to walk in hospital room?: A Little Help needed climbing 3-5 steps with a railing? : A Little 6 Click Score: 18    End of Session Equipment Utilized During Treatment: Gait belt Activity Tolerance: Patient tolerated treatment well Patient left: in chair;with call bell/phone within reach;with family/visitor present Nurse Communication: Mobility status PT Visit  Diagnosis: Difficulty in walking, not elsewhere classified (R26.2)     Time: 7048-8891 PT Time Calculation (min) (ACUTE ONLY): 16 min  Charges:  $Gait Training: 8-22 mins                     Arlyce Dice, DPT Acute Rehabilitation Services Pager: [phone] Office: [place] 05/04/2021, 3:10 PM

## 2021-05-04 NOTE — Op Note (Signed)
NAMEVERLYN, DANNENBERG MEDICAL RECORD NO: [id number] ACCOUNT NO: [id number] DATE OF BIRTH: 1951-02-14 FACILITY: [facility] LOCATION: [facility] PHYSICIAN: Dione Plover. 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  PROCEDURE:  Left total knee arthroplasty revision.  SURGEON:  Dione Plover. Chance Munter, MD.  ASSISTANTAlyse Low Edmiston, PA-C  ANESTHESIA:  Adductor canal block and spinal.  ESTIMATED BLOOD LOSS:  200 mL.  DRAINS:  None.  TOURNIQUET TIME:  69 minutes at 300 mmHg.  COMPLICATIONS:  None.  CONDITION:  Stable to recovery.  BRIEF CLINICAL NOTE: The patient is a 71 year old female who had a total knee arthroplasty done many years ago in an outside institution.  She presented to me last year with a significantly unstable knee. Bracing did not provide any significant benefit.   She had negative infection workup.  She presents now for total knee arthroplasty revision.  DESCRIPTION OF PROCEDURE:  After successful administration of adductor canal block and spinal, tourniquet was placed high on her left thigh and the left lower extremity prepped and draped in the usual sterile fashion.  Extremity was wrapped an Esmarch,  knee flexed, tourniquet inflated to 300 mmHg.  Midline incision was made with a 10 blade through subcutaneous tissue to the extensor mechanism.  A fresh blade was used to make a medial parapatellar arthrotomy.  Soft tissue in the proximal medial tibia  was subperiosteally elevated to the joint line with a knife and into the semimembranosus bursa with a Cobb elevator.  Soft tissue laterally was elevated with attention being paid to avoid any patellar tendon on the tibial tubercle.  Patella was everted,  knee flexed 90 degrees.  She had gross AP laxity as well as varus valgus laxity.  I was able  to remove the tibial polyethylene from the tray.  We then subluxed the tibia anteriorly and got circumferential retraction to address the tibial component.  Note that there was no fluid encountered upon entering the joint. There was no evidence of any synovitis.  I disrupted the interface between the tibial component and bone starting with an oscillating saw and then completing this with osteotomes.  I was  able to remove the tibial component with minimal bone loss.  We then used a drill to create a starting hole in the proximal tibia to get into the canal.  The canal was thoroughly irrigated with saline.  I reamed up to 16 mm for a 16 mm cemented stem.  We  then placed the extramedullary tibial alignment guide, referencing proximally at the medial aspect of the tibial tubercle and distally on the second metatarsal axis of the tibial crest.  The block was pinned to remove about 2 mm off the previously cut  surface.  A size 5 Attune tibial tray was the most appropriate size.  We prepared proximally with the modular drill and then did the keel punch.  I then prepared for a 29 mm cemented sleeve.  Given that she had a fairly thick polyethylene from her  previous operation, I felt that this will have to build up the tibial tray, so for the trial, we placed 5 mm augments medial and lateral.  Tibial preparation is thus completed.  I then sized for the cement restrictor, a size 6 was most appropriate and  then a size 6 cement restrictor was placed at the appropriate depth  in the tibial canal.  The femur was then addressed.  Osteotome was used to disrupt the interface between the femoral component and bone.  The femoral component was removed with minimal if any bone loss.  We created the starting hole in the distal femur to access the femoral  canal.  We thoroughly irrigated and then started reaming the canal up to 14 mm for 14 x 110 stem.  We placed the distal femoral cutting block over the stem, which was  serving as our intramedullary guide.  Approximately 2 mm were taken off the distal  femur.  I did not want to take more because she was hyperextending and I wanted to build the distal femur up to prevent hyperextension.  The size 5 was the most appropriate femoral component.  We then placed the cutting block for the size 5 and marked  the rotation off the epicondylar axis and confirmed by creating a rectangular flexion gap at 90 degrees.  The block was pinned in his rotation.  We got minimal to bone posteriorly.  No bone anteriorly.  No bone on the chamfers.  The intercondylar block  was placed and the intercondylar cut made for the constrained component.  We then formulated the trials. On the tibial sides, a size 5 Attune revision tray with 5 mm augments medial and lateral and a 29 sleeve and the stem on the tibial side is 16 x 80.   On the femoral side, we had the size 5 revision Attune femur with 5 mm augments medial and lateral distally and 5 mm augments medial and lateral posteriorly.  We also had a stem of 14 x 110.  Trials were placed with a 14 mm insert.  Full extension was  achieved with just a tiny bit of varus valgus laxity and so, we went up with the 16, which had full extension with excellent balance throughout full range of motion.  There was no laxity noted.  We did a patelloplasty removing the soft tissue overlying  the patella.  The component did not have any significant wear and was well fixed.  The component tracked normally in the trial femur, so I left the component intact.  The components were then assembled on the back table and the trials were removed.  The cut bone surfaces were prepared with pulsatile lavage.  Once the components were assembled, then the cement is mixed which is 3 batches of gentamicin impregnated  cement.  Once the cement was ready for implantation, we injected into the tibial canal and cemented the tibial component first.  Again, this is a size 5 Attune tibial  revision tray with a 16 x 80 stem, 5 mm augments medial and lateral and a 29 cemented  sleeve.  On the femoral side, we cemented distally and had press fit in the canal.  This was again a #5 Attune revision femur constrained with 4 mm augments distally, medial and lateral, 4 mm augments posteriorly medial and lateral and a 14 x 110 stem.   We cemented distally and the stem was press-fit.  A 16 mm insert trial was placed.  The knee was held in full extension.  All extruded cement removed.  When the cement was fully hardened, then the 16 mm constrained rotating platform polyethylene is  placed into the tibial tray.  The knee was reduced with excellent stability throughout full range of motion.  Wound was copiously irrigated with saline solution.  Arthrotomy was closed with a running 0 Stratafix  suture.  Flexion against gravity was about  120 degrees.  Tourniquet was released.  Total tourniquet time of 69 minutes.  Subcutaneous was then closed with interrupted 2-0 Vicryl and subcuticular running 4-0 Monocryl.  Incision was cleaned and dried.  Steri-Strips and a sterile dressing applied.   She was placed into a knee immobilizer, awakened, and transported to recovery in stable condition.  Note that a surgical assistant was a medical necessity for this procedure.  Assistant was necessary for retraction of vital ligaments and neurovascular structures and for proper positioning of the limb for safe removal of the old implant and safe and  accurate placement of the new implant.   MUK D: 05/03/2021 3:54:02 pm T: 05/04/2021 12:15:00 am  JOB: [id number]/ [id number]

## 2021-05-04 NOTE — Progress Notes (Signed)
° °  Subjective: 1 Day Post-Op Procedure(s) (LRB): TOTAL KNEE REVISION (Left) Patient reports pain as mild.   Patient seen in rounds by Dr. Wynelle Link. Patient is well, and has had no acute complaints or problems. Denies SOB, chest pain, or calf pain. No acute overnight events. Will start therapy today. Foley pulled this am.   Objective: Vital signs in last 24 hours: Temp:  [97.2 F (36.2 C)-98.4 F (36.9 C)] 97.7 F (36.5 C) (01/19 0700) Pulse Rate:  [45-69] 62 (01/19 0700) Resp:  [10-20] 18 (01/19 0700) BP: (90-135)/(60-94) 90/60 (01/19 0700) SpO2:  [94 %-100 %] 96 % (01/19 0700) Weight:  [61 kg] 61 kg (01/18 1057)  Intake/Output from previous day:  Intake/Output Summary (Last 24 hours) at 05/04/2021 0712 Last data filed at 05/04/2021 0600 Gross per 24 hour  Intake 2956.99 ml  Output 2825 ml  Net 131.99 ml     Intake/Output this shift: No intake/output data recorded.  Labs: Recent Labs    05/04/21 0249  HGB 10.3*   Recent Labs    05/04/21 0249  WBC 10.7*  RBC 3.35*  HCT 31.3*  PLT 207   Recent Labs    05/04/21 0249  NA 135  K 4.2  CL 103  CO2 27  BUN 14  CREATININE 0.52  GLUCOSE 124*  CALCIUM 8.4*   No results for input(s): LABPT, INR in the last 72 hours.  Exam: General - Patient is Alert and Oriented Extremity - Neurologically intact Neurovascular intact Intact pulses distally Dorsiflexion/Plantar flexion intact Dressing - dressing C/D/I Motor Function - intact, moving foot and toes well on exam.   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   Weight as of this encounter: 61 kg. Up with therapy   Patient's anticipated LOS is less than 2 midnights, meeting these requirements: - Lives within 1 hour of care - Has a competent adult at home to recover with post-op - NO history of  - Chronic pain requiring opioids  - Diabetes  - Coronary Artery Disease  - Heart failure  - Heart attack  - Stroke  - DVT/VTE  - Cardiac arrhythmia  - Respiratory Failure/COPD  - Renal failure  - Anemia  - Advanced Liver disease     DVT Prophylaxis - Aspirin and TED hose Weight bearing as tolerated. Continue therapy.  Plan is to go Home after hospital stay.  Plan for two sessions with PT today, and if meeting goals, will plan for discharge this afternoon.   Patient to follow up in two weeks with Dr. Wynelle Link in clinic.   The PDMP database was reviewed today prior to any opioid medications being prescribed to this patient.Fenton Foy, MBA, PA-C Orthopedic Surgery 05/04/2021, 7:12 AM

## 2021-05-04 NOTE — TOC Transition Note (Signed)
Transition of Care [facility]) - CM/SW Discharge Note  Patient Details  Name: CHLORIS MARCOUX MRN: [id number] Date of Birth: [birth year]  Transition of Care [facility] CM/SW Contact:  Sherie Don, LCSW Phone Number: 05/04/2021, 9:32 AM  Clinical Narrative: Patient is expected to discharge home after working with PT. CSW met with patient to confirm discharge plan and needs. Patient will discharge home with OPPT at [facility] in [place]. Patient has elevated toilets at home, but will need a rolling walker. [employer] to deliver rolling walker to patient's room. TOC signing off.  Final next level of care: OP Rehab Barriers to Discharge: No Barriers Identified  Patient Goals and CMS Choice Patient states their goals for this hospitalization and ongoing recovery are:: Discharge home with OPPT at [facility] in [email or web address] Compare Post Acute Care list provided to:: Patient Choice offered to / list presented to : Patient  Discharge Plan and Services         DME Arranged: Walker rolling DME Agency: [employer] Date DME Agency Contacted: 05/04/21 Representative spoke with at DME Agency: Ovid Curd  Readmission Risk Interventions No flowsheet data found.

## 2021-05-04 NOTE — Progress Notes (Signed)
Patients blood pressure low this morning. PA Fenton Foy notified of vital signs throughout the day. Two 500cc bolus ordered.  Also he cancelled the discharge order and the patient will stay the night and likely leave tomorrow.

## 2021-05-04 NOTE — Evaluation (Signed)
Physical Therapy Evaluation Patient Details Name: Bonnie Ramsey MRN: [id number] DOB: 12-16-1950 Today's Date: 05/04/2021  History of Present Illness  Pt is a 71 year old female s/p Left knee revision on 05/03/21  Clinical Impression  Pt is s/p left knee revision resulting in the deficits listed below (see PT Problem List).  Pt will benefit from skilled PT to increase their independence and safety with mobility to allow discharge to the venue listed below.   Pt ambulated in hallway and performed LE exercises.  Pt a little nauseated this morning.  Pt likely to d/c home later today.      Recommendations for follow up therapy are one component of a multi-disciplinary discharge planning process, led by the attending physician.  Recommendations may be updated based on patient status, additional functional criteria and insurance authorization.  Follow Up Recommendations Follow physician's recommendations for discharge plan and follow up therapies    Assistance Recommended at Discharge PRN  Patient can return home with the following       Equipment Recommendations Rolling walker (2 wheels)  Recommendations for Other Services       Functional Status Assessment Patient has had a recent decline in their functional status and demonstrates the ability to make significant improvements in function in a reasonable and predictable amount of time.     Precautions / Restrictions Precautions Precautions: Knee;Fall Restrictions Weight Bearing Restrictions: No Other Position/Activity Restrictions: WBAT      Mobility  Bed Mobility Overal bed mobility: Needs Assistance Bed Mobility: Supine to Sit     Supine to sit: Min guard, HOB elevated     General bed mobility comments: verbal cues for technique    Transfers Overall transfer level: Needs assistance Equipment used: Rolling walker (2 wheels) Transfers: Sit to/from Stand Sit to Stand: Min guard, Min assist           General transfer  comment: light assist to rise and steady, cues for UE and LE positioning    Ambulation/Gait Ambulation/Gait assistance: Min guard Gait Distance (Feet): 120 Feet Assistive device: Rolling walker (2 wheels) Gait Pattern/deviations: Step-to pattern, Decreased stance time - left, Antalgic       General Gait Details: verbal cues for sequence, step length, RW positioning, posture, slow pace  Stairs            Wheelchair Mobility    Modified Rankin (Stroke Patients Only)       Balance                                             Pertinent Vitals/Pain Pain Assessment Pain Assessment: 0-10 Pain Score: 4  Pain Location: left thigh Pain Descriptors / Indicators: Sore Pain Intervention(s): Repositioned, Premedicated before session, Monitored during session, Ice applied    Home Living Family/patient expects to be discharged to:: Private residence Living Arrangements: Spouse/significant other   Type of Home: House Home Access: Level entry       Home Layout: One level Home Equipment: None      Prior Function Prior Level of Function : Independent/Modified Independent                     Hand Dominance        Extremity/Trunk Assessment        Lower Extremity Assessment Lower Extremity Assessment: LLE deficits/detail LLE Deficits / Details: approx 5-80* AAROM  left knee       Communication   Communication: No difficulties  Cognition Arousal/Alertness: Awake/alert Behavior During Therapy: WFL for tasks assessed/performed Overall Cognitive Status: Within Functional Limits for tasks assessed                                          General Comments      Exercises Total Joint Exercises Ankle Circles/Pumps: AROM, Both, 10 reps Quad Sets: AROM, Left, 10 reps Heel Slides: Left, 10 reps, AAROM Hip ABduction/ADduction: AAROM, Left, 10 reps Straight Leg Raises: AAROM, Left, 10 reps Knee Flexion: AROM, Seated, Left,  5 reps   Assessment/Plan    PT Assessment Patient needs continued PT services  PT Problem List Decreased mobility;Decreased range of motion;Decreased strength;Pain;Decreased knowledge of use of DME       PT Treatment Interventions Stair training;Gait training;DME instruction;Therapeutic exercise;Balance training;Functional mobility training;Therapeutic activities;Patient/family education    PT Goals (Current goals can be found in the Care Plan section)  Acute Rehab PT Goals PT Goal Formulation: With patient Time For Goal Achievement: 05/10/21 Potential to Achieve Goals: Good    Frequency 7X/week     Co-evaluation               AM-PAC PT "6 Clicks" Mobility  Outcome Measure Help needed turning from your back to your side while in a flat bed without using bedrails?: A Little Help needed moving from lying on your back to sitting on the side of a flat bed without using bedrails?: A Little Help needed moving to and from a bed to a chair (including a wheelchair)?: A Little Help needed standing up from a chair using your arms (e.g., wheelchair or bedside chair)?: A Little Help needed to walk in hospital room?: A Little Help needed climbing 3-5 steps with a railing? : A Little 6 Click Score: 18    End of Session Equipment Utilized During Treatment: Gait belt Activity Tolerance: Patient tolerated treatment well Patient left: in chair;with call bell/phone within reach;with chair alarm set Nurse Communication: Mobility status PT Visit Diagnosis: Difficulty in walking, not elsewhere classified (R26.2)    Time: 3300-7622 PT Time Calculation (min) (ACUTE ONLY): 23 min   Charges:   PT Evaluation $PT Eval Low Complexity: 1 Low PT Treatments $Therapeutic Exercise: 8-22 mins   Jannette Spanner PT, DPT Acute Rehabilitation Services Pager: [phone] Office: [phone]    Myrtis Hopping [place] 05/04/2021, 12:49 PM

## 2021-05-04 NOTE — Plan of Care (Signed)
Plan of care reviewed and discussed with the patient. 

## 2021-05-04 NOTE — Plan of Care (Signed)
  Problem: Activity: Goal: Risk for activity intolerance will decrease Outcome: Progressing   Problem: Pain Managment: Goal: General experience of comfort will improve Outcome: Progressing   Problem: Safety: Goal: Ability to remain free from injury will improve Outcome: Progressing   

## 2021-05-05 ENCOUNTER — Encounter (HOSPITAL_COMMUNITY): Payer: Self-pay | Admitting: Orthopedic Surgery

## 2021-05-05 LAB — CBC
HCT: 26.5 % — ABNORMAL LOW (ref 36.0–46.0)
Hemoglobin: 8.8 g/dL — ABNORMAL LOW (ref 12.0–15.0)
MCH: 31.1 pg (ref 26.0–34.0)
MCHC: 33.2 g/dL (ref 30.0–36.0)
MCV: 93.6 fL (ref 80.0–100.0)
Platelets: 181 10*3/uL (ref 150–400)
RBC: 2.83 MIL/uL — ABNORMAL LOW (ref 3.87–5.11)
RDW: 13.4 % (ref 11.5–15.5)
WBC: 7.5 10*3/uL (ref 4.0–10.5)
nRBC: 0 % (ref 0.0–0.2)

## 2021-05-05 LAB — BASIC METABOLIC PANEL
Anion gap: 4 — ABNORMAL LOW (ref 5–15)
BUN: 12 mg/dL (ref 8–23)
CO2: 25 mmol/L (ref 22–32)
Calcium: 8.4 mg/dL — ABNORMAL LOW (ref 8.9–10.3)
Chloride: 107 mmol/L (ref 98–111)
Creatinine, Ser: 0.56 mg/dL (ref 0.44–1.00)
GFR, Estimated: >60 mL/min (ref >60)
Glucose, Bld: 103 mg/dL — ABNORMAL HIGH (ref 70–99)
Potassium: 4.2 mmol/L (ref 3.5–5.1)
Sodium: 136 mmol/L (ref 135–145)

## 2021-05-05 NOTE — Progress Notes (Signed)
Physical Therapy Treatment Patient Details Name: Bonnie Ramsey MRN: [id number] DOB: [birth year] Today's Date: 05/05/2021   History of Present Illness Pt is a 71 year old female s/p Left knee revision on 05/03/21    PT Comments    Pt eager to mobilize and discharge home today.   Pt ambulated 200 feet with RW in hallway and denies any lightheadedness.  Pt had no questions about exercises and feels she can perform these on her own once home (still has HEP handout from yesterday).  Pt eager to d/c home.    Recommendations for follow up therapy are one component of a multi-disciplinary discharge planning process, led by the attending physician.  Recommendations may be updated based on patient status, additional functional criteria and insurance authorization.  Follow Up Recommendations  Follow physician's recommendations for discharge plan and follow up therapies     Assistance Recommended at Discharge PRN  Patient can return home with the following     Equipment Recommendations  Rolling walker (2 wheels)    Recommendations for Other Services       Precautions / Restrictions Precautions Precautions: Knee;Fall Restrictions Weight Bearing Restrictions: Yes Other Position/Activity Restrictions: WBAT     Mobility  Bed Mobility               General bed mobility comments: pt in recliner    Transfers Overall transfer level: Needs assistance Equipment used: Rolling walker (2 wheels) Transfers: Sit to/from Stand Sit to Stand: Supervision           General transfer comment: cues for UE and LE positioning, and fully backing up to chair prior to sitting (attempted to sit while turning into chair)    Ambulation/Gait Ambulation/Gait assistance: Min guard, Supervision Gait Distance (Feet): 200 Feet Assistive device: Rolling walker (2 wheels) Gait Pattern/deviations: Step-to pattern, Decreased stance time - left, Antalgic       General Gait Details: verbal cues for  sequence, step length, RW positioning, posture   Stairs             Wheelchair Mobility    Modified Rankin (Stroke Patients Only)       Balance                                            Cognition Arousal/Alertness: Awake/alert Behavior During Therapy: WFL for tasks assessed/performed Overall Cognitive Status: Within Functional Limits for tasks assessed                                          Exercises      General Comments        Pertinent Vitals/Pain Pain Assessment Pain Assessment: 0-10 Pain Score: 5  Pain Location: left thigh Pain Descriptors / Indicators: Sore Pain Intervention(s): Monitored during session, Repositioned    Home Living                          Prior Function            PT Goals (current goals can now be found in the care plan section) Progress towards PT goals: Progressing toward goals    Frequency    7X/week      PT Plan Current plan remains appropriate  Co-evaluation              AM-PAC PT "6 Clicks" Mobility   Outcome Measure  Help needed turning from your back to your side while in a flat bed without using bedrails?: A Little Help needed moving from lying on your back to sitting on the side of a flat bed without using bedrails?: A Little Help needed moving to and from a bed to a chair (including a wheelchair)?: A Little Help needed standing up from a chair using your arms (e.g., wheelchair or bedside chair)?: A Little Help needed to walk in hospital room?: A Little Help needed climbing 3-5 steps with a railing? : A Little 6 Click Score: 18    End of Session   Activity Tolerance: Patient tolerated treatment well Patient left: in chair;with call bell/phone within reach Nurse Communication: Mobility status PT Visit Diagnosis: Difficulty in walking, not elsewhere classified (R26.2)     Time: 8677-3736 PT Time Calculation (min) (ACUTE ONLY): 16  min  Charges:  $Gait Training: 8-22 mins                    Arlyce Dice, DPT Acute Rehabilitation Services Pager: [phone] Office: [place] 05/05/2021, 12:20 PM

## 2021-05-05 NOTE — Progress Notes (Signed)
° °  Subjective: 2 Days Post-Op Procedure(s) (LRB): TOTAL KNEE REVISION (Left) Patient reports pain as mild.   Patient seen in rounds by Dr. Wynelle Link. Patient is well, and has had no acute complaints or problems. Blood pressure continues to be low, but patient has had no lightheadedness or dizziness associated with this. Will continue to monitor. No issues overnight. Denies chest pain or SOB. Plan is to go Home after hospital stay.  Objective: Vital signs in last 24 hours: Temp:  [97.9 F (36.6 C)-98.4 F (36.9 C)] 97.9 F (36.6 C) (01/20 0445) Pulse Rate:  [50-71] 66 (01/20 0445) Resp:  [15-17] 15 (01/20 0445) BP: (82-100)/(50-66) 92/57 (01/20 0445) SpO2:  [94 %-100 %] 94 % (01/20 0445)  Intake/Output from previous day:  Intake/Output Summary (Last 24 hours) at 05/05/2021 0718 Last data filed at 05/05/2021 0445 Gross per 24 hour  Intake 600 ml  Output 600 ml  Net 0 ml    Intake/Output this shift: No intake/output data recorded.  Labs: Recent Labs    05/04/21 0249 05/05/21 0253  HGB 10.3* 8.8*   Recent Labs    05/04/21 0249 05/05/21 0253  WBC 10.7* 7.5  RBC 3.35* 2.83*  HCT 31.3* 26.5*  PLT 207 181   Recent Labs    05/04/21 0249 05/05/21 0253  NA 135 136  K 4.2 4.2  CL 103 107  CO2 27 25  BUN 14 12  CREATININE 0.52 0.56  GLUCOSE 124* 103*  CALCIUM 8.4* 8.4*   No results for input(s): LABPT, INR in the last 72 hours.  Exam: General - Patient is Alert and Oriented Extremity - Neurologically intact Neurovascular intact Sensation intact distally Dorsiflexion/Plantar flexion intact Dressing/Incision - clean, dry, no drainage Motor Function - intact, moving foot and toes well on exam.   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   Weight as of this encounter: 61 kg. Up with therapy  DVT Prophylaxis - Aspirin Weight-bearing as tolerated  Plan for discharge if meeting goals with therapy and doing well.  Theresa Duty, PA-C Orthopedic Surgery [phone] 05/05/2021, 7:18 AM

## 2021-05-08 NOTE — Discharge Summary (Signed)
Patient ID: Bonnie Ramsey MRN: [id number] DOB/AGE: Jul 28, 1950 71 y.o.  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 female who was admitted 05/03/2021 for operative treatment ofFailed total knee arthroplasty ([place]). Patient has severe unremitting pain that affects sleep, daily activities, and work/hobbies. After pre-op clearance the patient was taken to the operating room on 05/03/2021 and underwent  Procedure(s): TOTAL KNEE REVISION.    Patient was given perioperative antibiotics:  Anti-infectives (From admission, onward)    Start     Dose/Rate Route Frequency Ordered Stop   05/03/21 2000  ceFAZolin (ANCEF) IVPB 2g/100 mL premix        2 g 200 mL/hr over 30 Minutes Intravenous Every 6 hours 05/03/21 1759 05/04/21 0339   05/03/21 1045  ceFAZolin (ANCEF) IVPB 2g/100 mL premix        2 g 200 mL/hr over 30 Minutes Intravenous On call to O.R. 05/03/21 1033 05/03/21 1406        Patient was given sequential compression devices, early ambulation, and chemoprophylaxis to prevent DVT.  Patient benefited maximally from hospital stay and there were no complications.    Recent vital signs: No data found.   Recent laboratory studies: No results for input(s): WBC, HGB, HCT, PLT, NA, K, CL, CO2, BUN, CREATININE, GLUCOSE, INR, CALCIUM in the last 72 hours.  Invalid input(s): PT, 2   Discharge Medications:    Allergies as of 05/05/2021       Reactions   Cipro [ciprofloxacin-ciproflox Hcl Er] Hives   Morphine And Related Nausea And Vomiting        Medication List     STOP taking these medications    meloxicam 15 MG tablet Commonly known as: MOBIC       TAKE these medications    acetaminophen 325 MG tablet Commonly known as: TYLENOL Take 650 mg by mouth every 6 (six) hours as needed for moderate pain.   aspirin 325 MG EC tablet Take 1 tablet (325 mg total) by mouth 2 (two) times daily. Then take one 81 mg aspirin once a day for three weeks. Then discontinue aspirin.   Calcium Carb-Cholecalciferol 600-400 MG-UNIT Tabs Take 1 tablet by mouth daily.   gabapentin 300 MG capsule Commonly known as: NEURONTIN Take 1 capsule (300 mg total) by mouth 3 (three) times daily. For back pain. What changed:  how much to take when to take this additional instructions   methocarbamol 500 MG tablet Commonly known as: ROBAXIN Take 1 tablet (500 mg total) by mouth every 6 (six) hours as needed for muscle spasms.   OMEGA 3-6-9 COMPLEX PO Take 1 capsule by mouth daily.   oxyCODONE 5 MG immediate release tablet Commonly known as: Oxy IR/ROXICODONE Take 1-2 tablets (5-10 mg total) by mouth every 6 (six) hours as needed for severe pain.   traMADol 50 MG tablet Commonly known as: ULTRAM Take 1-2 tablets (  50-100 mg total) by mouth every 6 (six) hours as needed for moderate pain.               Discharge Care Instructions  (From admission, onward)           Start     Ordered   05/04/21 0000  Weight bearing as tolerated        05/04/21 0716   05/04/21 0000  Change dressing       Comments: You may remove the bulky bandage (ACE wrap and gauze) two days after surgery. You will have an adhesive waterproof bandage underneath. Leave this in place until your first follow-up appointment.   05/04/21 0716            Diagnostic Studies: No results found.  Disposition: Discharge  disposition: 01-Home or Self Care       Discharge Instructions     Call MD / Call 911   Complete by: As directed    If you experience chest pain or shortness of breath, CALL 911 and be transported to the hospital emergency room.  If you develope a fever above 101 F, pus (white drainage) or increased drainage or redness at the wound, or calf pain, call your surgeon's office.   Change dressing   Complete by: As directed    You may remove the bulky bandage (ACE wrap and gauze) two days after surgery. You will have an adhesive waterproof bandage underneath. Leave this in place until your first follow-up appointment.   Constipation Prevention   Complete by: As directed    Drink plenty of fluids.  Prune juice may be helpful.  You may use a stool softener, such as Colace (over the counter) 100 mg twice a day.  Use MiraLax (over the counter) for constipation as needed.   Diet - low sodium heart healthy   Complete by: As directed    Do not put a pillow under the knee. Place it under the heel.   Complete by: As directed    Driving restrictions   Complete by: As directed    No driving for two weeks   Post-operative opioid taper instructions:   Complete by: As directed    POST-OPERATIVE OPIOID TAPER INSTRUCTIONS: It is important to wean off of your opioid medication as soon as possible. If you do not need pain medication after your surgery it is ok to stop day one. Opioids include: Codeine, Hydrocodone(Norco, Vicodin), Oxycodone(Percocet, oxycontin) and hydromorphone amongst others.  Long term and even short term use of opiods can cause: Increased pain response Dependence Constipation Depression Respiratory depression And more.  Withdrawal symptoms can include Flu like symptoms Nausea, vomiting And more Techniques to manage these symptoms Hydrate well Eat regular healthy meals Stay active Use relaxation techniques(deep breathing, meditating, yoga) Do Not substitute Alcohol to help  with tapering If you have been on opioids for less than two weeks and do not have pain than it is ok to stop all together.  Plan to wean off of opioids This plan should start within one week post op of your joint replacement. Maintain the same interval or time between taking each dose and first decrease the dose.  Cut the total daily intake of opioids by one tablet each day Next start to increase the time between doses. The last dose that should be eliminated is the evening dose.      TED hose   Complete by: As directed    Use stockings (TED hose) for  three weeks on both leg(s).  You may remove them at night for sleeping.   Weight bearing as tolerated   Complete by: As directed         Follow-up Information     Aluisio, [name], MD Follow up in 2 week(s).   Specialty: Orthopedic Surgery Contact information: [address] [place] [address] [phone]                  Signed: Theresa Duty 05/08/2021, 8:05 AM

## 2021-05-15 ENCOUNTER — Other Ambulatory Visit: Payer: Self-pay | Admitting: Podiatry

## 2021-05-15 NOTE — Telephone Encounter (Signed)
Please advise 

## 2021-06-08 NOTE — Telephone Encounter (Signed)
Left message for patient to call me back to follow up

## 2021-06-13 NOTE — Telephone Encounter (Signed)
Spoke with patient. Lab scheduled for 06/19/21 and Nurse Visit on 06/21/21. Calcium was low the last 2 lab draws.

## 2021-06-13 NOTE — Addendum Note (Signed)
Addended by: Kris Mouton on: 06/13/2021 04:08 PM   Modules accepted: Orders

## 2021-06-13 NOTE — Telephone Encounter (Signed)
Pt called returning your call 

## 2021-06-19 ENCOUNTER — Other Ambulatory Visit (INDEPENDENT_AMBULATORY_CARE_PROVIDER_SITE_OTHER): Payer: Medicare Other

## 2021-06-19 ENCOUNTER — Other Ambulatory Visit: Payer: Self-pay

## 2021-06-19 DIAGNOSIS — M810 Age-related osteoporosis without current pathological fracture: Secondary | ICD-10-CM

## 2021-06-19 LAB — BASIC METABOLIC PANEL
BUN: 10 mg/dL (ref 6–23)
CO2: 29 mEq/L (ref 19–32)
Calcium: 9.8 mg/dL (ref 8.4–10.5)
Chloride: 99 mEq/L (ref 96–112)
Creatinine, Ser: 0.72 mg/dL (ref 0.40–1.20)
GFR: 83.83 mL/min (ref >60.00)
Glucose, Bld: 102 mg/dL — ABNORMAL HIGH (ref 70–99)
Potassium: 4.4 mEq/L (ref 3.5–5.1)
Sodium: 136 mEq/L (ref 135–145)

## 2021-06-20 ENCOUNTER — Encounter: Payer: Self-pay | Admitting: Primary Care

## 2021-06-20 NOTE — Telephone Encounter (Signed)
CrCl is 69.01 mL/min. ?Calcium normal 9.8 ?

## 2021-06-21 ENCOUNTER — Ambulatory Visit (INDEPENDENT_AMBULATORY_CARE_PROVIDER_SITE_OTHER): Payer: Medicare Other

## 2021-06-21 ENCOUNTER — Other Ambulatory Visit: Payer: Self-pay

## 2021-06-21 DIAGNOSIS — M810 Age-related osteoporosis without current pathological fracture: Secondary | ICD-10-CM | POA: Diagnosis not present

## 2021-06-21 MED ORDER — DENOSUMAB 60 MG/ML ~~LOC~~ SOSY
60.0000 mg | PREFILLED_SYRINGE | Freq: Once | SUBCUTANEOUS | Status: AC
  Administered 2021-06-21: 60 mg via SUBCUTANEOUS

## 2021-06-21 NOTE — Progress Notes (Signed)
Per orders of Allie Bossier, NP injection of Prolia given by Loreen Freud. ?Patient tolerated injection well.  ?

## 2021-06-28 ENCOUNTER — Other Ambulatory Visit: Payer: Self-pay

## 2021-06-28 ENCOUNTER — Ambulatory Visit (INDEPENDENT_AMBULATORY_CARE_PROVIDER_SITE_OTHER): Payer: Medicare Other | Admitting: Podiatry

## 2021-06-28 DIAGNOSIS — M722 Plantar fascial fibromatosis: Secondary | ICD-10-CM

## 2021-06-28 MED ORDER — MELOXICAM 15 MG PO TABS: 15.0000 mg | ORAL_TABLET | Freq: Every day | ORAL | 1 refills | Status: DC | PRN

## 2021-06-28 NOTE — Progress Notes (Signed)
? ?  Subjective: ?71 y.o. female for evaluation of left heel pain is been going on for a few weeks now.  She says that she developed pain in the heel and is very tender and symptomatic.  Patient states that she has been doing physical therapy and stretching exercises and she believes that possibly she over strained the arch in her foot.  She presents for further treatment evaluation ? ?Past Medical History:  ?Diagnosis Date  ? Blood in stool   ? 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 treated with excision   ? 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 right Chest treated with excision   ? ?Past Surgical History:  ?Procedure Laterality Date  ? BREAST CYST ASPIRATION    ? COLONOSCOPY WITH PROPOFOL N/A 09/03/2019  ? Procedure: COLONOSCOPY WITH PROPOFOL;  Surgeon: Virgel Manifold, MD;  Location: [facility] ENDOSCOPY;  Service: Endoscopy;  Laterality: N/A;  ? SPINAL FUSION  2015, 2016, 2017  ? Lumbar  ? TOTAL KNEE REVISION Left 05/03/2021  ? Procedure: TOTAL KNEE REVISION;  Surgeon: Gaynelle Arabian, MD;  Location: [facility] ORS;  Service: Orthopedics;  Laterality: Left;  ? ?Allergies  ?Allergen Reactions  ? Cipro [Ciprofloxacin-Ciproflox Hcl Er] Hives  ? Morphine And Related Nausea And Vomiting  ? ? ? ?Objective: ?Physical Exam ?General: The patient is alert and oriented x3 in no acute distress. ? ?Dermatology: Skin is warm, dry and supple bilateral lower extremities. Negative for open lesions or macerations bilateral.  ? ?Vascular: Dorsalis Pedis and Posterior Tibial pulses palpable bilateral.  Capillary fill time is immediate to all digits. ? ?Neurological: Epicritic and protective threshold intact bilateral.  ? ?Musculoskeletal: Tenderness to palpation along the left midfoot along the medial longitudinal arch of the foot.  Pes planovalgus deformity also noted with a hallux valgus deformity. ? ?Assessment: ?1. Plantar fasciitis left midfoot ?2.  Pes planovalgus deformity left ?3.  Hallux valgus left ?4.  PSxHx recent LT knee replacement.  Dr. Ricki Rodriguez. H/o LT foot surgery ? ?Plan of Care:  ?1. Patient evaluated. Xrays reviewed.   ?2. Injection of 0.5cc Celestone soluspan injected into the left plantar fascia.  ?3.  Continue wearing good supportive shoes and insoles ?4. Rx for Meloxicam ordered for patient. ?5.  Instructed patient regarding therapies and modalities at home to alleviate symptoms.  ?6.  Return to clinic as needed ? ? ?Edrick Kins, DPM ?[place] ? ?Dr. Edrick Kins, DPM  ?  ?[address]                                     ?[place], [place] [address]                ?Office [phone]  ?Fax [phone] ? ? ? ? ?

## 2021-07-17 ENCOUNTER — Ambulatory Visit: Payer: Medicare Other

## 2021-08-14 ENCOUNTER — Telehealth: Payer: Self-pay | Admitting: Primary Care

## 2021-08-14 NOTE — Telephone Encounter (Signed)
Called to schedule AWV with [facility], but N/A. Will call the patient back at a later time, ?

## 2021-08-27 ENCOUNTER — Other Ambulatory Visit: Payer: Self-pay | Admitting: Primary Care

## 2021-08-27 DIAGNOSIS — M545 Low back pain, unspecified: Secondary | ICD-10-CM

## 2021-08-27 NOTE — Telephone Encounter (Signed)
Patient due for general follow up. ?Please schedule. ?

## 2021-08-28 NOTE — Telephone Encounter (Signed)
Patient called back she is not able to come in office until after 10/03/21 I have made appointment.  ?

## 2021-09-05 ENCOUNTER — Telehealth: Payer: Self-pay | Admitting: Primary Care

## 2021-09-05 NOTE — Telephone Encounter (Signed)
Left message for patient to call back and schedule Medicare Annual Wellness Visit (AWV) either virtually or phone   Last AWV 07/15/20  please schedule at anytime with health coach    I left my direct # [phone]

## 2021-09-13 NOTE — Telephone Encounter (Signed)
Patient declined services

## 2021-09-13 NOTE — Telephone Encounter (Signed)
L/m asking pt to call [phone]  Patient returned my call

## 2021-10-09 ENCOUNTER — Other Ambulatory Visit: Payer: Self-pay | Admitting: Primary Care

## 2021-10-09 DIAGNOSIS — Z1231 Encounter for screening mammogram for malignant neoplasm of breast: Secondary | ICD-10-CM

## 2021-10-10 ENCOUNTER — Ambulatory Visit (INDEPENDENT_AMBULATORY_CARE_PROVIDER_SITE_OTHER): Payer: Medicare Other | Admitting: Primary Care

## 2021-10-10 ENCOUNTER — Encounter: Payer: Self-pay | Admitting: Primary Care

## 2021-10-10 VITALS — BP 102/60 | HR 76 | Temp 98.6°F | Ht 65.0 in | Wt 131.0 lb

## 2021-10-10 DIAGNOSIS — Z13220 Encounter for screening for lipoid disorders: Secondary | ICD-10-CM | POA: Diagnosis not present

## 2021-10-10 DIAGNOSIS — F4323 Adjustment disorder with mixed anxiety and depressed mood: Secondary | ICD-10-CM | POA: Diagnosis not present

## 2021-10-10 DIAGNOSIS — R001 Bradycardia, unspecified: Secondary | ICD-10-CM

## 2021-10-10 DIAGNOSIS — M545 Low back pain, unspecified: Secondary | ICD-10-CM | POA: Diagnosis not present

## 2021-10-10 DIAGNOSIS — M810 Age-related osteoporosis without current pathological fracture: Secondary | ICD-10-CM | POA: Diagnosis not present

## 2021-10-10 DIAGNOSIS — G8929 Other chronic pain: Secondary | ICD-10-CM

## 2021-10-10 DIAGNOSIS — Z96652 Presence of left artificial knee joint: Secondary | ICD-10-CM

## 2021-10-10 DIAGNOSIS — G479 Sleep disorder, unspecified: Secondary | ICD-10-CM | POA: Diagnosis not present

## 2021-10-10 LAB — LIPID PANEL
Cholesterol: 244 mg/dL — ABNORMAL HIGH (ref 0–200)
HDL: 114.8 mg/dL (ref >39.00)
LDL Cholesterol: 117 mg/dL — ABNORMAL HIGH (ref 0–99)
NonHDL: 129.64
Total CHOL/HDL Ratio: 2
Triglycerides: 61 mg/dL (ref 0.0–149.0)
VLDL: 12.2 mg/dL (ref 0.0–40.0)

## 2021-10-10 LAB — COMPREHENSIVE METABOLIC PANEL
ALT: 13 U/L (ref 0–35)
AST: 20 U/L (ref 0–37)
Albumin: 4.4 g/dL (ref 3.5–5.2)
Alkaline Phosphatase: 49 U/L (ref 39–117)
BUN: 10 mg/dL (ref 6–23)
CO2: 28 mEq/L (ref 19–32)
Calcium: 10.4 mg/dL (ref 8.4–10.5)
Chloride: 101 mEq/L (ref 96–112)
Creatinine, Ser: 0.79 mg/dL (ref 0.40–1.20)
GFR: 74.84 mL/min (ref >60.00)
Glucose, Bld: 83 mg/dL (ref 70–99)
Potassium: 3.9 mEq/L (ref 3.5–5.1)
Sodium: 139 mEq/L (ref 135–145)
Total Bilirubin: 0.6 mg/dL (ref 0.2–1.2)
Total Protein: 6.6 g/dL (ref 6.0–8.3)

## 2021-10-10 LAB — CBC
HCT: 39.2 % (ref 36.0–46.0)
Hemoglobin: 13.1 g/dL (ref 12.0–15.0)
MCHC: 33.5 g/dL (ref 30.0–36.0)
MCV: 88.4 fl (ref 78.0–100.0)
Platelets: 205 10*3/uL (ref 150.0–400.0)
RBC: 4.43 Mil/uL (ref 3.87–5.11)
RDW: 16 % — ABNORMAL HIGH (ref 11.5–15.5)
WBC: 3.5 10*3/uL — ABNORMAL LOW (ref 4.0–10.5)

## 2021-10-10 NOTE — Assessment & Plan Note (Signed)
Controlled.  Continue gabapentin 600 mg HS.  Continue meloxicam 15 mg as needed.

## 2021-10-10 NOTE — Assessment & Plan Note (Signed)
Doing well. Commended her on regular walking.

## 2021-10-10 NOTE — Assessment & Plan Note (Signed)
Bone density scan UTD. Continue Prolia 60 mg semi-annually.  Continue calcium and vitamin D. Commended her on weight bearing exercise.

## 2021-10-13 ENCOUNTER — Ambulatory Visit
Admission: RE | Admit: 2021-10-13 | Discharge: 2021-10-13 | Disposition: A | Discharge status: Home / Self Care | Payer: Medicare Other | Source: Ambulatory Visit | Attending: Primary Care | Admitting: Primary Care

## 2021-10-13 DIAGNOSIS — Z1231 Encounter for screening mammogram for malignant neoplasm of breast: Secondary | ICD-10-CM | POA: Diagnosis present

## 2021-10-16 ENCOUNTER — Other Ambulatory Visit: Payer: Self-pay | Admitting: Primary Care

## 2021-10-16 DIAGNOSIS — R928 Other abnormal and inconclusive findings on diagnostic imaging of breast: Secondary | ICD-10-CM

## 2021-10-16 DIAGNOSIS — N6489 Other specified disorders of breast: Secondary | ICD-10-CM

## 2021-10-18 ENCOUNTER — Ambulatory Visit
Admission: RE | Admit: 2021-10-18 | Discharge: 2021-10-18 | Disposition: A | Discharge status: Home / Self Care | Payer: Medicare Other | Source: Ambulatory Visit | Attending: Primary Care | Admitting: Primary Care

## 2021-10-18 DIAGNOSIS — N6489 Other specified disorders of breast: Secondary | ICD-10-CM | POA: Diagnosis present

## 2021-10-18 DIAGNOSIS — R928 Other abnormal and inconclusive findings on diagnostic imaging of breast: Secondary | ICD-10-CM

## 2021-10-27 ENCOUNTER — Other Ambulatory Visit: Payer: Self-pay | Admitting: Primary Care

## 2021-10-27 DIAGNOSIS — M545 Low back pain, unspecified: Secondary | ICD-10-CM

## 2021-12-13 ENCOUNTER — Telehealth: Payer: Self-pay

## 2021-12-13 DIAGNOSIS — M810 Age-related osteoporosis without current pathological fracture: Secondary | ICD-10-CM

## 2021-12-13 NOTE — Telephone Encounter (Signed)
Benefit submitted. Pending  Next injection due 12/23/21

## 2021-12-14 NOTE — Telephone Encounter (Signed)
OOP cost $0 Left message to call back

## 2022-01-03 NOTE — Telephone Encounter (Signed)
Left message to call back  

## 2022-01-08 NOTE — Telephone Encounter (Signed)
Left message for patient's sister to ask patient to give [place] a call back

## 2022-01-09 NOTE — Telephone Encounter (Signed)
Spoke with Ms Kennis Carina, patient's sister, patient is on the cruise and will not be back until maybe the end of October. Will follow up at that time.

## 2022-02-16 ENCOUNTER — Ambulatory Visit (INDEPENDENT_AMBULATORY_CARE_PROVIDER_SITE_OTHER): Payer: Medicare Other

## 2022-02-16 VITALS — Ht 65.0 in | Wt 131.0 lb

## 2022-02-16 DIAGNOSIS — Z0000 Encounter for general adult medical examination without abnormal findings: Secondary | ICD-10-CM

## 2022-02-16 NOTE — Patient Instructions (Addendum)
[name] , Thank you for taking time to come for your Medicare Wellness Visit. I appreciate your ongoing commitment to your health goals. Please review the following plan we discussed and let me know if I can assist you in the future.   These are the goals we discussed:  Goals       Patient Stated (pt-stated)      I will continue to walk and stretch 3 days a week for 1 hour.        This is a list of the screening recommended for you and due dates:  Health Maintenance  Topic Date Due   COVID-19 Vaccine (6 - Moderna risk series) 03/04/2022*   Flu Shot  07/15/2022*   Hepatitis C Screening: USPSTF Recommendation to screen - Ages 18-79 yo.  02/17/2023*   Medicare Annual Wellness Visit  02/17/2023   Mammogram  10/14/2023   Tetanus Vaccine  04/16/2025   Colon Cancer Screening  09/02/2029   Pneumonia Vaccine  Completed   DEXA scan (bone density measurement)  Completed   Zoster (Shingles) Vaccine  Completed   HPV Vaccine  Aged Out  *Topic was postponed. The date shown is not the original due date.    Advanced directives: Please bring a copy of your health care power of attorney and living will to the office to be added to your chart at your convenience.   Conditions/risks identified: None  Next appointment: Follow up in one year for your annual wellness visit     Preventive Care 65 Years and Older, Female Preventive care refers to lifestyle choices and visits with your health care provider that can promote health and wellness. What does preventive care include? A yearly physical exam. This is also called an annual well check. Dental exams once or twice a year. Routine eye exams. Ask your health care provider how often you should have your eyes checked. Personal lifestyle choices, including: Daily care of your teeth and gums. Regular physical activity. Eating a healthy diet. Avoiding tobacco and drug use. Limiting alcohol use. Practicing safe sex. Taking low-dose aspirin  every day. Taking vitamin and mineral supplements as recommended by your health care provider. What happens during an annual well check? The services and screenings done by your health care provider during your annual well check will depend on your age, overall health, lifestyle risk factors, and family history of disease. Counseling  Your health care provider may ask you questions about your: Alcohol use. Tobacco use. Drug use. Emotional well-being. Home and relationship well-being. Sexual activity. Eating habits. History of falls. Memory and ability to understand (cognition). Work and work Statistician. Reproductive health. Screening  You may have the following tests or measurements: Height, weight, and BMI. Blood pressure. Lipid and cholesterol levels. These may be checked every 5 years, or more frequently if you are over 34 years old. Skin check. Lung cancer screening. You may have this screening every year starting at age 59 if you have a 30-pack-year history of smoking and currently smoke or have quit within the past 15 years. Fecal occult blood test (FOBT) of the stool. You may have this test every year starting at age 55. Flexible sigmoidoscopy or colonoscopy. You may have a sigmoidoscopy every 5 years or a colonoscopy every 10 years starting at age 58. Hepatitis C blood test. Hepatitis B blood test. Sexually transmitted disease (STD) testing. Diabetes screening. This is done by checking your blood sugar (glucose) after you have not eaten for a while (fasting). You  may have this done every 1-3 years. Bone density scan. This is done to screen for osteoporosis. You may have this done starting at age 79. Mammogram. This may be done every 1-2 years. Talk to your health care provider about how often you should have regular mammograms. Talk with your health care provider about your test results, treatment options, and if necessary, the need for more tests. Vaccines  Your health  care provider may recommend certain vaccines, such as: Influenza vaccine. This is recommended every year. Tetanus, diphtheria, and acellular pertussis (Tdap, Td) vaccine. You may need a Td booster every 10 years. Zoster vaccine. You may need this after age 33. Pneumococcal 13-valent conjugate (PCV13) vaccine. One dose is recommended after age 31. Pneumococcal polysaccharide (PPSV23) vaccine. One dose is recommended after age 55. Talk to your health care provider about which screenings and vaccines you need and how often you need them. This information is not intended to replace advice given to you by your health care provider. Make sure you discuss any questions you have with your health care provider. Document Released: 04/29/2015 Document Revised: 12/21/2015 Document Reviewed: 02/01/2015 Elsevier Interactive Patient Education  2017 [place] Prevention in the Home Falls can cause injuries. They can happen to people of all ages. There are many things you can do to make your home safe and to help prevent falls. What can I do on the outside of my home? Regularly fix the edges of walkways and driveways and fix any cracks. Remove anything that might make you trip as you walk through a door, such as a raised step or threshold. Trim any bushes or trees on the path to your home. Use bright outdoor lighting. Clear any walking paths of anything that might make someone trip, such as rocks or tools. Regularly check to see if handrails are loose or broken. Make sure that both sides of any steps have handrails. Any raised decks and porches should have guardrails on the edges. Have any leaves, snow, or ice cleared regularly. Use sand or salt on walking paths during winter. Clean up any spills in your garage right away. This includes oil or grease spills. What can I do in the bathroom? Use night lights. Install grab bars by the toilet and in the tub and shower. Do not use towel bars as grab  bars. Use non-skid mats or decals in the tub or shower. If you need to sit down in the shower, use a plastic, non-slip stool. Keep the floor dry. Clean up any water that spills on the floor as soon as it happens. Remove soap buildup in the tub or shower regularly. Attach bath mats securely with double-sided non-slip rug tape. Do not have throw rugs and other things on the floor that can make you trip. What can I do in the bedroom? Use night lights. Make sure that you have a light by your bed that is easy to reach. Do not use any sheets or blankets that are too big for your bed. They should not hang down onto the floor. Have a firm chair that has side arms. You can use this for support while you get dressed. Do not have throw rugs and other things on the floor that can make you trip. What can I do in the kitchen? Clean up any spills right away. Avoid walking on wet floors. Keep items that you use a lot in easy-to-reach places. If you need to reach something above you, use a strong  step stool that has a grab bar. Keep electrical cords out of the way. Do not use floor polish or wax that makes floors slippery. If you must use wax, use non-skid floor wax. Do not have throw rugs and other things on the floor that can make you trip. What can I do with my stairs? Do not leave any items on the stairs. Make sure that there are handrails on both sides of the stairs and use them. Fix handrails that are broken or loose. Make sure that handrails are as long as the stairways. Check any carpeting to make sure that it is firmly attached to the stairs. Fix any carpet that is loose or worn. Avoid having throw rugs at the top or bottom of the stairs. If you do have throw rugs, attach them to the floor with carpet tape. Make sure that you have a light switch at the top of the stairs and the bottom of the stairs. If you do not have them, ask someone to add them for you. What else can I do to help prevent  falls? Wear shoes that: Do not have high heels. Have rubber bottoms. Are comfortable and fit you well. Are closed at the toe. Do not wear sandals. If you use a stepladder: Make sure that it is fully opened. Do not climb a closed stepladder. Make sure that both sides of the stepladder are locked into place. Ask someone to hold it for you, if possible. Clearly mark and make sure that you can see: Any grab bars or handrails. First and last steps. Where the edge of each step is. Use tools that help you move around (mobility aids) if they are needed. These include: Canes. Walkers. Scooters. Crutches. Turn on the lights when you go into a dark area. Replace any light bulbs as soon as they burn out. Set up your furniture so you have a clear path. Avoid moving your furniture around. If any of your floors are uneven, fix them. If there are any pets around you, be aware of where they are. Review your medicines with your doctor. Some medicines can make you feel dizzy. This can increase your chance of falling. Ask your doctor what other things that you can do to help prevent falls. This information is not intended to replace advice given to you by your health care provider. Make sure you discuss any questions you have with your health care provider. Document Released: 01/27/2009 Document Revised: 09/08/2015 Document Reviewed: 05/07/2014 Elsevier Interactive Patient Education  2017 [employer].

## 2022-02-16 NOTE — Progress Notes (Signed)
Subjective:   Bonnie Ramsey is a 71 y.o. female who presents for Medicare Annual (Subsequent) preventive examination.  Review of Systems    Virtual Visit via Telephone Note  I connected with  Lumina Gitto Ramsey on 02/16/22 at  9:15 AM EDT by telephone and verified that I am speaking with the correct person using two identifiers.  Location: Patient: Home Provider: Office Persons participating in the virtual visit: patient/Nurse Health Advisor   I discussed the limitations, risks, security and privacy concerns of performing an evaluation and management service by telephone and the availability of in person appointments. The patient expressed understanding and agreed to proceed.  Interactive audio and video telecommunications were attempted between this nurse and patient, however failed, due to patient having technical difficulties OR patient did not have access to video capability.  We continued and completed visit with audio only.  Some vital signs may be absent or patient reported.   Bonnie Peaches, LPN  Cardiac Risk Factors include: advanced age (>58mn, >>27women)     Objective:    Today's Vitals   02/16/22 0906  Weight: 131 lb (59.4 kg)  Height: '5\' 5"'$  (1.651 m)   Body mass index is 21.8 kg/m.     02/16/2022    9:17 AM 05/03/2021    6:04 PM 04/24/2021   10:03 AM 07/15/2020    9:57 AM 09/06/2019    7:53 AM 09/03/2019    9:44 AM  Advanced Directives  Does Patient Have a Medical Advance Directive? Yes Yes Yes Yes Yes Yes  Type of AParamedicof AJ[place]Living will [place]Living will HNezper[place]Living will HClaritaLiving will Living will Living will  Does patient want to make changes to medical advance directive?  No - Patient declined   No - Patient declined   Copy of [place]in Chart? No - copy requested   No - copy requested      Current Medications  (verified) Outpatient Encounter Medications as of 02/16/2022  Medication Sig   acetaminophen (TYLENOL) 325 MG tablet Take 650 mg by mouth every 6 (six) hours as needed for moderate pain.   aspirin 325 MG EC tablet Take 1 tablet (325 mg total) by mouth 2 (two) times daily. Then take one 81 mg aspirin once a day for three weeks. Then discontinue aspirin.   Calcium Carb-Cholecalciferol 600-400 MG-UNIT TABS Take 1 tablet by mouth daily.   denosumab (PROLIA) 60 MG/ML SOSY injection Inject 60 mg into the skin every 6 (six) months. Ordered through providers office. Do not send to pharmacy-ah   gabapentin (NEURONTIN) 300 MG capsule Take 2 capsules (600 mg total) by mouth at bedtime. For back pain   meloxicam (MOBIC) 15 MG tablet Take 1 tablet (15 mg total) by mouth daily as needed for pain.   Omega 3-6-9 Fatty Acids (OMEGA 3-6-9 COMPLEX PO) Take 1 capsule by mouth daily.   No facility-administered encounter medications on file as of 02/16/2022.    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    right Chest treated with excision    Past Surgical History:  Procedure Laterality Date   BREAST CYST ASPIRATION  COLONOSCOPY WITH PROPOFOL N/A 09/03/2019   Procedure: COLONOSCOPY WITH PROPOFOL;  Surgeon: Virgel Manifold, MD;  Location: [facility] ENDOSCOPY;  Service: Endoscopy;  Laterality: N/A;   HAND SURGERY Left    SPINAL FUSION  2015, 2016, 2017   Lumbar   TOTAL KNEE REVISION Left 05/03/2021   Procedure: TOTAL KNEE REVISION;  Surgeon: Gaynelle Arabian, MD;  Location: [facility] ORS;  Service: Orthopedics;  Laterality: Left;   Family History  Problem Relation Age of Onset   Breast cancer Mother 88   Social History   Socioeconomic History   Marital status: Single     Spouse name: Not on file   Number of children: Not on file   Years of education: Not on file   Highest education level: Not on file  Occupational History   Not on file  Tobacco Use   Smoking status: Never   Smokeless tobacco: Never  Vaping Use   Vaping Use: Never used  Substance and Sexual Activity   Alcohol use: Yes    Comment: 2 per month   Drug use: Never   Sexual activity: Not on file  Other Topics Concern   Not on file  Social History Narrative   Not on file   Social Determinants of Health   Financial Resource Strain: Low Risk  (02/16/2022)   Overall Financial Resource Strain (CARDIA)    Difficulty of Paying Living Expenses: Not hard at all  Food Insecurity: No Food Insecurity (02/16/2022)   Hunger Vital Sign    Worried About Running Out of Food in the Last Year: Never true    [place] in the Last Year: Never true  Transportation Needs: No Transportation Needs (02/16/2022)   PRAPARE - Hydrologist (Medical): No    Lack of Transportation (Non-Medical): No  Physical Activity: Sufficiently Active (02/16/2022)   Exercise Vital Sign    Days of Exercise per Week: 5 days    Minutes of Exercise per Session: 90 min  Stress: No Stress Concern Present (02/16/2022)   [place]    Feeling of Stress : Not at all  Social Connections: Moderately Integrated (02/16/2022)   Social Connection and Isolation Panel [NHANES]    Frequency of Communication with Friends and Family: More than three times a week    Frequency of Social Gatherings with Friends and Family: More than three times a week    Attends Religious Services: More than 4 times per year    Active Member of [employer] or Organizations: Yes    Attends Music therapist: More than 4 times per year    Marital Status: Never married    Tobacco Counseling Counseling given: Not Answered   Clinical Intake:  Pre-visit  preparation completed: No  Pain : No/denies pain     BMI - recorded: 21.8 Nutritional Status: BMI of 19-24  Normal Nutritional Risks: None Diabetes: No  How often do you need to have someone help you when you read instructions, pamphlets, or other written materials from your doctor or pharmacy?: 1 - Never  Diabetic?  No  Interpreter Needed?: No  Information entered by :: Rolene Arbour LPN   Activities of Daily Living    02/16/2022    9:14 AM 10/10/2021   12:01 PM  In your present state of health, do you have any difficulty performing the following activities:  Hearing? 0 1  Comment  history of hearing loss  Vision? 0  0  Difficulty concentrating or making decisions? 0 0  Walking or climbing stairs? 0 0  Dressing or bathing? 0 0  Doing errands, shopping? 0 0  Preparing Food and eating ? N   Using the Toilet? N   In the past six months, have you accidently leaked urine? N   Do you have problems with loss of bowel control? N   Managing your Medications? N   Managing your Finances? N   Housekeeping or managing your Housekeeping? N     Patient Care Team: Pleas Koch, NP as PCP - General (Internal Medicine) Dasher, Rayvon Char, MD (Dermatology) Gaynelle Arabian, MD as Consulting Physician (Orthopedic Surgery)  Indicate any recent Medical Services you may have received from other than [facility] providers in the past year (date may be approximate).     Assessment:   This is a routine wellness examination for Lexee.  Hearing/Vision screen Hearing Screening - Comments:: Denies hearing difficulties   Vision Screening - Comments:: Wears rx glasses - up to date with routine eye exams with  [facility]  Dietary issues and exercise activities discussed: Exercise limited by: None identified   Goals Addressed               This Visit's Progress     Patient Stated (pt-stated)        I will continue to walk and stretch 3 days a week for 1 hour.       Depression  Screen    02/16/2022    9:12 AM 10/10/2021   12:01 PM 02/17/2021   10:54 AM 07/15/2020    9:58 AM  PHQ 2/9 Scores  PHQ - 2 Score 0 0 0 4  PHQ- 9 Score  0  5    Fall Risk    02/16/2022    9:15 AM 10/10/2021   12:01 PM 07/15/2020    9:57 AM  [place] in the past year? 0 0 0  Number falls in past yr: 0 0 0  Injury with Fall? 0 0 0  Risk for fall due to : No Fall Risks  No Fall Risks  Follow up Falls prevention discussed Falls evaluation completed Falls evaluation completed;Falls prevention discussed    FALL RISK PREVENTION PERTAINING TO THE HOME:  Any stairs in or around the home? Yes  If so, are there any without handrails? No  Home free of loose throw rugs in walkways, pet beds, electrical cords, etc? Yes  Adequate lighting in your home to reduce risk of falls? Yes   ASSISTIVE DEVICES UTILIZED TO PREVENT FALLS:  Life alert? No  Use of a cane, walker or w/c? No  Grab bars in the bathroom? Yes  Shower chair or bench in shower? Yes  Elevated toilet seat or a handicapped toilet? No   TIMED UP AND GO:  Was the test performed? No . Audio Visit   Cognitive Function:    07/15/2020   10:18 AM  MMSE - Mini Mental State Exam  Not completed: Unable to complete        02/16/2022    9:17 AM  6CIT Screen  What Year? 0 points  What month? 0 points  What time? 0 points  Count back from 20 0 points  Months in reverse 0 points  Repeat phrase 0 points  Total Score 0 points    Immunizations Immunization History  Administered Date(s) Administered   Fluad Quad(high Dose 65+) 02/17/2021   Influenza, Seasonal, Injecte,  Preservative Fre 01/15/2015   Influenza,inj,Quad PF,6+ Mos 01/19/2016   Influenza-Unspecified 02/15/2008, 01/23/2018, 01/15/2020   Moderna Sars-Covid-2 Vaccination 05/11/2019, 06/08/2019, 04/17/2020, 08/18/2020   PFIZER(Purple Top)SARS-COV-2 Vaccination 01/12/2021   Pneumococcal Conjugate-13 11/15/2014   Pneumococcal Polysaccharide-23 09/26/2016   Td  08/15/2007, 04/17/2015   Tdap 04/16/2010   Zoster Recombinat (Shingrix) 04/16/2016, 07/15/2016   Zoster, Live 04/16/2010    TDAP status: Up to date  Flu Vaccine status: Up to date  Pneumococcal vaccine status: Up to date  Covid-19 vaccine status: Completed vaccines  Qualifies for Shingles Vaccine? Yes   Zostavax completed Yes   Shingrix Completed?: Yes  Screening Tests Health Maintenance  Topic Date Due   COVID-19 Vaccine (6 - Moderna risk series) 03/04/2022 (Originally 03/09/2021)   INFLUENZA VACCINE  07/15/2022 (Originally 11/14/2021)   Hepatitis C Screening  02/17/2023 (Originally 07/23/1968)   Medicare Annual Wellness (AWV)  02/17/2023   MAMMOGRAM  10/14/2023   TETANUS/TDAP  04/16/2025   COLONOSCOPY (Pts 45-71yr Insurance coverage will need to be confirmed)  09/02/2029   Pneumonia Vaccine 72 Years old  Completed   DEXA SCAN  Completed   Zoster Vaccines- Shingrix  Completed   HPV VACCINES  Aged Out    Health Maintenance  There are no preventive care reminders to display for this patient.   Colorectal cancer screening: Type of screening: Colonoscopy. Completed 09/03/19. Repeat every 10 years  Mammogram status: Completed 10/13/21. Repeat every year  Bone Density status: Completed 08/24/20. Results reflect: Bone density results: OSTEOPOROSIS. Repeat every   years.  Lung Cancer Screening: (Low Dose CT Chest recommended if Age 353-80years, 30 pack-year currently smoking OR have quit w/in 15years.) does not qualify.    Additional Screening:  Hepatitis C Screening: does qualify; Completed Deferred  Vision Screening: Recommended annual ophthalmology exams for early detection of glaucoma and other disorders of the eye. Is the patient up to date with their annual eye exam?  Yes  Who is the provider or what is the name of the office in which the patient attends annual eye exams? [facility] If pt is not established with a provider, would they like to be referred to a  provider to establish care? No .   Dental Screening: Recommended annual dental exams for proper oral hygiene  Community Resource Referral / Chronic Care Management:  CRR required this visit?  No   CCM required this visit?  No      Plan:     I have personally reviewed and noted the following in the patient's chart:   Medical and social history Use of alcohol, tobacco or illicit drugs  Current medications and supplements including opioid prescriptions. Patient is not currently taking opioid prescriptions. Functional ability and status Nutritional status Physical activity Advanced directives List of other physicians Hospitalizations, surgeries, and ER visits in previous 12 months Vitals Screenings to include cognitive, depression, and falls Referrals and appointments  In addition, I have reviewed and discussed with patient certain preventive protocols, quality metrics, and best practice recommendations. A written personalized care plan for preventive services as well as general preventive health recommendations were provided to patient.     BCriselda Peaches LPN   [id number]  Nurse Notes: Patient due Labs Hep-C Screening

## 2022-02-22 ENCOUNTER — Telehealth: Payer: Self-pay | Admitting: Primary Care

## 2022-02-22 NOTE — Addendum Note (Signed)
Addended by: Kris Mouton on: 02/22/2022 03:15 PM   Modules accepted: Orders

## 2022-02-22 NOTE — Telephone Encounter (Signed)
Patient called to return Bonnie Ramsey phone call

## 2022-02-22 NOTE — Telephone Encounter (Signed)
See other phone note-re Prolia. Called patient back

## 2022-02-22 NOTE — Telephone Encounter (Signed)
Left message to call back to schedule labs and NV  OOP cost is $0

## 2022-02-22 NOTE — Telephone Encounter (Signed)
Patient advised lab 11/13 and NV 02/28/22 OOP cost is $0

## 2022-02-26 ENCOUNTER — Other Ambulatory Visit (INDEPENDENT_AMBULATORY_CARE_PROVIDER_SITE_OTHER): Payer: Medicare Other

## 2022-02-26 DIAGNOSIS — M810 Age-related osteoporosis without current pathological fracture: Secondary | ICD-10-CM

## 2022-02-26 LAB — BASIC METABOLIC PANEL
BUN: 12 mg/dL (ref 6–23)
CO2: 29 mEq/L (ref 19–32)
Calcium: 11 mg/dL — ABNORMAL HIGH (ref 8.4–10.5)
Chloride: 101 mEq/L (ref 96–112)
Creatinine, Ser: 0.87 mg/dL (ref 0.40–1.20)
GFR: 66.48 mL/min (ref >60.00)
Glucose, Bld: 97 mg/dL (ref 70–99)
Potassium: 4.3 mEq/L (ref 3.5–5.1)
Sodium: 138 mEq/L (ref 135–145)

## 2022-02-27 ENCOUNTER — Other Ambulatory Visit: Payer: Self-pay

## 2022-02-27 DIAGNOSIS — M810 Age-related osteoporosis without current pathological fracture: Secondary | ICD-10-CM

## 2022-02-28 ENCOUNTER — Ambulatory Visit: Payer: Medicare Other

## 2022-03-12 ENCOUNTER — Other Ambulatory Visit (INDEPENDENT_AMBULATORY_CARE_PROVIDER_SITE_OTHER): Payer: Medicare Other

## 2022-03-12 DIAGNOSIS — M810 Age-related osteoporosis without current pathological fracture: Secondary | ICD-10-CM | POA: Diagnosis not present

## 2022-03-12 LAB — BASIC METABOLIC PANEL
BUN: 14 mg/dL (ref 6–23)
CO2: 27 mEq/L (ref 19–32)
Calcium: 9.6 mg/dL (ref 8.4–10.5)
Chloride: 99 mEq/L (ref 96–112)
Creatinine, Ser: 0.82 mg/dL (ref 0.40–1.20)
GFR: 71.35 mL/min (ref >60.00)
Glucose, Bld: 121 mg/dL — ABNORMAL HIGH (ref 70–99)
Potassium: 4.1 mEq/L (ref 3.5–5.1)
Sodium: 136 mEq/L (ref 135–145)

## 2022-03-13 NOTE — Telephone Encounter (Signed)
NV rescheduled to 03/27/22, lab was done on 03/12/22 CrCl is 58.15 mL/min.Calcium normal at 9.6

## 2022-03-14 ENCOUNTER — Other Ambulatory Visit: Payer: Medicare Other

## 2022-03-20 ENCOUNTER — Telehealth: Payer: Self-pay

## 2022-03-20 NOTE — Telephone Encounter (Signed)
Prolia VOB initiated via [email or web address] 

## 2022-03-20 NOTE — Telephone Encounter (Signed)
Pt ready for scheduling on or after 03/20/22  Out-of-pocket cost due at time of visit: $0  Primary: Medicare Prolia co-insurance: 0% Admin fee co-insurance: 0%  Secondary: Tricare-MEDSUP Prolia co-insurance:  Admin fee co-insurance:   Deductible: $226 Met of $226 Required  Prior Auth:  PA# Valid:   ** This summary of benefits is an estimation of the patient's out-of-pocket cost. Exact cost may vary based on individual plan coverage.

## 2022-03-27 ENCOUNTER — Ambulatory Visit (INDEPENDENT_AMBULATORY_CARE_PROVIDER_SITE_OTHER): Payer: Medicare Other | Admitting: *Deleted

## 2022-03-27 DIAGNOSIS — M810 Age-related osteoporosis without current pathological fracture: Secondary | ICD-10-CM

## 2022-03-27 MED ORDER — DENOSUMAB 60 MG/ML ~~LOC~~ SOSY
60.0000 mg | PREFILLED_SYRINGE | Freq: Once | SUBCUTANEOUS | Status: AC
  Administered 2022-03-27: 60 mg via SUBCUTANEOUS

## 2022-03-27 NOTE — Progress Notes (Signed)
Per orders of Allie Bossier NP, injection of Prolia given by Emelia Salisbury LPN Patient tolerated injection well.

## 2022-04-03 NOTE — Telephone Encounter (Signed)
Prolia was completed on 03/27/2022.

## 2022-06-15 HISTORY — PX: CATARACT EXTRACTION: SUR2

## 2022-08-20 ENCOUNTER — Telehealth: Payer: Self-pay | Admitting: Primary Care

## 2022-08-20 DIAGNOSIS — M545 Low back pain, unspecified: Secondary | ICD-10-CM

## 2022-08-20 MED ORDER — GABAPENTIN 300 MG PO CAPS: 600.0000 mg | ORAL_CAPSULE | Freq: Every day | ORAL | 0 refills | Status: DC

## 2022-08-20 NOTE — Addendum Note (Signed)
Addended by: Doreene Nest on: 08/20/2022 04:47 PM   Modules accepted: Orders

## 2022-08-20 NOTE — Telephone Encounter (Signed)
Prescription Request  08/20/2022  LOV: 10/10/2021  What is the name of the medication or equipment? gabapentin (NEURONTIN) 300 MG capsule   Have you contacted your pharmacy to request a refill? No   Which pharmacy would you like this sent to?   [facility] [phone] [place], [place] - [address] [place] [address] Phone: [phone] Fax: [phone]    Patient notified that their request is being sent to the clinical staff for review and that they should receive a response within 2 business days.   Please advise at Mobile [phone] (mobile)

## 2022-08-20 NOTE — Telephone Encounter (Signed)
Refills sent to pharmacy. 

## 2022-10-23 ENCOUNTER — Ambulatory Visit (INDEPENDENT_AMBULATORY_CARE_PROVIDER_SITE_OTHER): Payer: Medicare Other | Admitting: Primary Care

## 2022-10-23 ENCOUNTER — Encounter: Payer: Self-pay | Admitting: Primary Care

## 2022-10-23 VITALS — BP 112/58 | HR 52 | Temp 98.2°F | Ht 65.0 in | Wt 132.0 lb

## 2022-10-23 DIAGNOSIS — D729 Disorder of white blood cells, unspecified: Secondary | ICD-10-CM

## 2022-10-23 DIAGNOSIS — G8929 Other chronic pain: Secondary | ICD-10-CM

## 2022-10-23 DIAGNOSIS — Z23 Encounter for immunization: Secondary | ICD-10-CM | POA: Diagnosis not present

## 2022-10-23 DIAGNOSIS — Z1211 Encounter for screening for malignant neoplasm of colon: Secondary | ICD-10-CM

## 2022-10-23 DIAGNOSIS — M545 Low back pain, unspecified: Secondary | ICD-10-CM

## 2022-10-23 DIAGNOSIS — E2839 Other primary ovarian failure: Secondary | ICD-10-CM

## 2022-10-23 DIAGNOSIS — E785 Hyperlipidemia, unspecified: Secondary | ICD-10-CM | POA: Insufficient documentation

## 2022-10-23 DIAGNOSIS — M810 Age-related osteoporosis without current pathological fracture: Secondary | ICD-10-CM | POA: Diagnosis not present

## 2022-10-23 DIAGNOSIS — Z1231 Encounter for screening mammogram for malignant neoplasm of breast: Secondary | ICD-10-CM

## 2022-10-23 LAB — CBC WITH DIFFERENTIAL/PLATELET
Basophils Absolute: 0 10*3/uL (ref 0.0–0.1)
Basophils Relative: 0.9 % (ref 0.0–3.0)
Eosinophils Absolute: 0.1 10*3/uL (ref 0.0–0.7)
Eosinophils Relative: 4 % (ref 0.0–5.0)
HCT: 39.8 % (ref 36.0–46.0)
Hemoglobin: 13.3 g/dL (ref 12.0–15.0)
Lymphocytes Relative: 32.8 % (ref 12.0–46.0)
Lymphs Abs: 1.2 10*3/uL (ref 0.7–4.0)
MCHC: 33.3 g/dL (ref 30.0–36.0)
MCV: 91.5 fl (ref 78.0–100.0)
Monocytes Absolute: 0.3 10*3/uL (ref 0.1–1.0)
Monocytes Relative: 8 % (ref 3.0–12.0)
Neutro Abs: 1.9 10*3/uL (ref 1.4–7.7)
Neutrophils Relative %: 54.3 % (ref 43.0–77.0)
Platelets: 240 10*3/uL (ref 150.0–400.0)
RBC: 4.35 Mil/uL (ref 3.87–5.11)
RDW: 13.8 % (ref 11.5–15.5)
WBC: 3.6 10*3/uL — ABNORMAL LOW (ref 4.0–10.5)

## 2022-10-23 LAB — COMPREHENSIVE METABOLIC PANEL
ALT: 13 U/L (ref 0–35)
AST: 20 U/L (ref 0–37)
Albumin: 4.5 g/dL (ref 3.5–5.2)
Alkaline Phosphatase: 60 U/L (ref 39–117)
BUN: 9 mg/dL (ref 6–23)
CO2: 30 mEq/L (ref 19–32)
Calcium: 10.6 mg/dL — ABNORMAL HIGH (ref 8.4–10.5)
Chloride: 100 mEq/L (ref 96–112)
Creatinine, Ser: 0.81 mg/dL (ref 0.40–1.20)
GFR: 72.1 mL/min (ref >60.00)
Glucose, Bld: 90 mg/dL (ref 70–99)
Potassium: 4.4 mEq/L (ref 3.5–5.1)
Sodium: 136 mEq/L (ref 135–145)
Total Bilirubin: 0.6 mg/dL (ref 0.2–1.2)
Total Protein: 7 g/dL (ref 6.0–8.3)

## 2022-10-23 LAB — LIPID PANEL
Cholesterol: 267 mg/dL — ABNORMAL HIGH (ref 0–200)
HDL: 106.1 mg/dL (ref >39.00)
LDL Cholesterol: 148 mg/dL — ABNORMAL HIGH (ref 0–99)
NonHDL: 160.54
Total CHOL/HDL Ratio: 3
Triglycerides: 64 mg/dL (ref 0.0–149.0)
VLDL: 12.8 mg/dL (ref 0.0–40.0)

## 2022-10-23 NOTE — Progress Notes (Signed)
Subjective:    Patient ID: Bonnie Ramsey, female    DOB: 1951-04-13, 72 y.o.   MRN: [id number]  HPI  Bonnie Ramsey is a very pleasant 72 y.o. female with a history of sinus bradycardia, osteoporosis, chronic back pain who presents today for follow-up of chronic conditions.  Immunizations: -Tetanus: Completed in 2017 -Shingles: Completed Shingrix series -Pneumonia: Completed Prevnar 13 in 2016, Pneumovax 23 in 2018  Mammogram: July 2023 Bone Density Scan: May 2022  Colonoscopy: Completed in May 2021, due August 2021, never completed.   BP Readings from Last 3 Encounters:  10/23/22 (!) 112/58  10/10/21 102/60  05/05/21 (!) 92/57      Review of Systems  Respiratory:  Negative for shortness of breath.   Cardiovascular:  Negative for chest pain.  Gastrointestinal:  Positive for constipation.  Musculoskeletal:  Positive for arthralgias.  Neurological:  Negative for dizziness and headaches.  Psychiatric/Behavioral:  Negative for sleep disturbance. The patient is not nervous/anxious.          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    right Chest treated with excision     Social History   Socioeconomic History   Marital status: Single    Spouse name: Not on file   Number of children: Not on file   Years of education: Not on file   Highest education level: Not on file  Occupational History   Not on file  Tobacco Use   Smoking status: Never   Smokeless tobacco: Never  Vaping Use   Vaping Use: Never used  Substance and Sexual Activity   Alcohol use: Yes    Comment: 2 per month   Drug use: Never   Sexual activity: Not on file  Other Topics Concern   Not on file  Social History Narrative   Not on file   Social Determinants of Health   Financial Resource Strain: Low Risk   (02/16/2022)   Overall Financial Resource Strain (CARDIA)    Difficulty of Paying Living Expenses: Not hard at all  Food Insecurity: No Food Insecurity (02/16/2022)   Hunger Vital Sign    Worried About Running Out of Food in the Last Year: Never true    Ran Out of Food in the Last Year: Never true  Transportation Needs: No Transportation Needs (02/16/2022)   PRAPARE - Administrator, Civil Service (Medical): No    Lack of Transportation (Non-Medical): No  Physical Activity: Sufficiently Active (02/16/2022)   Exercise Vital Sign    Days of Exercise per Week: 5 days    Minutes of Exercise per Session: 90 min  Stress: No Stress Concern Present (02/16/2022)   [employer] of Occupational Health - Occupational Stress Questionnaire    Feeling of Stress : Not at all  Social Connections: Moderately Integrated (02/16/2022)   Social Connection and Isolation Panel [NHANES]    Frequency of Communication with Friends and Family: More than three times a week    Frequency of Social Gatherings with Friends and Family: More than three times a week    Attends Religious Services: More than 4 times per year    Active Member of [employer] or Organizations: Yes    Attends Banker Meetings: More than 4 times per year  Marital Status: Never married  Intimate Partner Violence: Not At Risk (02/16/2022)   Humiliation, Afraid, Rape, and Kick questionnaire    Fear of Current or Ex-Partner: No    Emotionally Abused: No    Physically Abused: No    Sexually Abused: No    Past Surgical History:  Procedure Laterality Date   BREAST CYST ASPIRATION     CATARACT EXTRACTION Bilateral 06/2022   COLONOSCOPY WITH PROPOFOL N/A 09/03/2019   Procedure: COLONOSCOPY WITH PROPOFOL;  Surgeon: Pasty Spillers, MD;  Location: [facility];  Service: Endoscopy;  Laterality: N/A;   HAND SURGERY Left    SPINAL FUSION  2015, 2016, 2017   Lumbar   TOTAL KNEE REVISION Left 05/03/2021   Procedure: TOTAL  KNEE REVISION;  Surgeon: Ollen Gross, MD;  Location: [facility];  Service: Orthopedics;  Laterality: Left;    Family History  Problem Relation Age of Onset   Breast cancer Mother 14    Allergies  Allergen Reactions   Cipro [Ciprofloxacin-Ciproflox Hcl Er] Hives   Morphine And Codeine Nausea And Vomiting    Current Outpatient Medications on File Prior to Visit  Medication Sig Dispense Refill   acetaminophen (TYLENOL) 325 MG tablet Take 650 mg by mouth every 6 (six) hours as needed for moderate pain.     aspirin 325 MG EC tablet Take 1 tablet (325 mg total) by mouth 2 (two) times daily. Then take one 81 mg aspirin once a day for three weeks. Then discontinue aspirin. 40 tablet 0   Calcium Carb-Cholecalciferol 600-400 MG-UNIT TABS Take 1 tablet by mouth daily.     denosumab (PROLIA) 60 MG/ML SOSY injection Inject 60 mg into the skin every 6 (six) months. Ordered through providers office. Do not send to pharmacy-ah     gabapentin (NEURONTIN) 300 MG capsule Take 2 capsules (600 mg total) by mouth at bedtime. For back pain 180 capsule 0   meloxicam (MOBIC) 15 MG tablet Take 1 tablet (15 mg total) by mouth daily as needed for pain. 30 tablet 1   Omega 3-6-9 Fatty Acids (OMEGA 3-6-9 COMPLEX PO) Take 1 capsule by mouth daily.     No current facility-administered medications on file prior to visit.    BP (!) 112/58   Pulse (!) 52   Temp 98.2 F (36.8 C) (Temporal)   Ht 5\' 5"  (1.651 m)   Wt 132 lb (59.9 kg)   SpO2 98%   BMI 21.97 kg/m  Objective:   Physical Exam Cardiovascular:     Rate and Rhythm: Normal rate and regular rhythm.  Pulmonary:     Effort: Pulmonary effort is normal.     Breath sounds: Normal breath sounds.  Abdominal:     General: Bowel sounds are normal.     Palpations: Abdomen is soft.     Tenderness: There is no abdominal tenderness.  Musculoskeletal:     Cervical back: Neck supple.  Skin:    General: Skin is warm and dry.  Neurological:     Mental Status:  She is alert.  Psychiatric:        Mood and Affect: Mood normal.           Assessment & Plan:  Hyperlipidemia, unspecified hyperlipidemia type Assessment & Plan: Repeat lipid panel pending. Remain off treatment.   Orders: -     Lipid panel -     Comprehensive metabolic panel -     CBC with Differential/Platelet  Screening mammogram for breast cancer -  3D Screening Mammogram, Left and Right; Future  Estrogen deficiency -     DG Bone Density; Future  Screening for colon cancer -     Ambulatory referral to Gastroenterology  Osteoporosis, unspecified osteoporosis type, unspecified pathological fracture presence Assessment & Plan: Repeat bone density scan due and pending.  Continue calcium and vitamin D daily. Continue Prolia 60 mg semi-annually.   CMP pending.   Chronic bilateral low back pain, unspecified whether sciatica present Assessment & Plan: Controlled.  Continue gabapentin 600 mg HS. Continue Meloxicam 15 mg PRN.         Doreene Nest, NP

## 2022-10-23 NOTE — Assessment & Plan Note (Signed)
Controlled.  Continue gabapentin 600 mg HS. Continue Meloxicam 15 mg PRN.

## 2022-10-23 NOTE — Patient Instructions (Signed)
Stop by the lab prior to leaving today. I will notify you of your results once received.   [facility] to schedule your mammogram and bone density scan.   You will either be contacted via phone regarding your referral to GI for the colonoscopy, or you may receive a letter on your MyChart portal from our referral team with instructions for scheduling an appointment. Please let us know if you have not been contacted by anyone within two weeks.  It was a pleasure to see you today!

## 2022-10-23 NOTE — Assessment & Plan Note (Signed)
Repeat bone density scan due and pending.  Continue calcium and vitamin D daily. Continue Prolia 60 mg semi-annually.   CMP pending.

## 2022-10-23 NOTE — Assessment & Plan Note (Signed)
Repeat lipid panel pending. Remain off treatment. 

## 2022-10-24 ENCOUNTER — Ambulatory Visit (INDEPENDENT_AMBULATORY_CARE_PROVIDER_SITE_OTHER): Payer: Medicare Other

## 2022-10-24 DIAGNOSIS — M810 Age-related osteoporosis without current pathological fracture: Secondary | ICD-10-CM | POA: Diagnosis not present

## 2022-10-24 DIAGNOSIS — D729 Disorder of white blood cells, unspecified: Secondary | ICD-10-CM

## 2022-10-24 LAB — VITAMIN D 25 HYDROXY (VIT D DEFICIENCY, FRACTURES): VITD: 29.48 ng/mL — ABNORMAL LOW (ref 30.00–100.00)

## 2022-10-24 NOTE — Addendum Note (Signed)
Addended by: Alvina Chou on: 10/24/2022 10:37 AM   Modules accepted: Orders

## 2022-10-25 ENCOUNTER — Telehealth: Payer: Self-pay

## 2022-10-25 LAB — PATHOLOGIST SMEAR REVIEW

## 2022-10-25 IMAGING — MG MM DIGITAL SCREENING BILAT W/ TOMO AND CAD
6 of 10 series · 6 of 30 positions shown · non-contrast
Comparison: Previous exam(s).

CLINICAL DATA: Screening.

EXAM:
DIGITAL SCREENING BILATERAL MAMMOGRAM WITH TOMOSYNTHESIS AND CAD
TECHNIQUE: Bilateral screening digital craniocaudal and mediolateral oblique
mammograms were obtained. Bilateral screening digital breast
tomosynthesis was performed. The images were evaluated with
computer-aided detection.

[R MLO synth-2D]
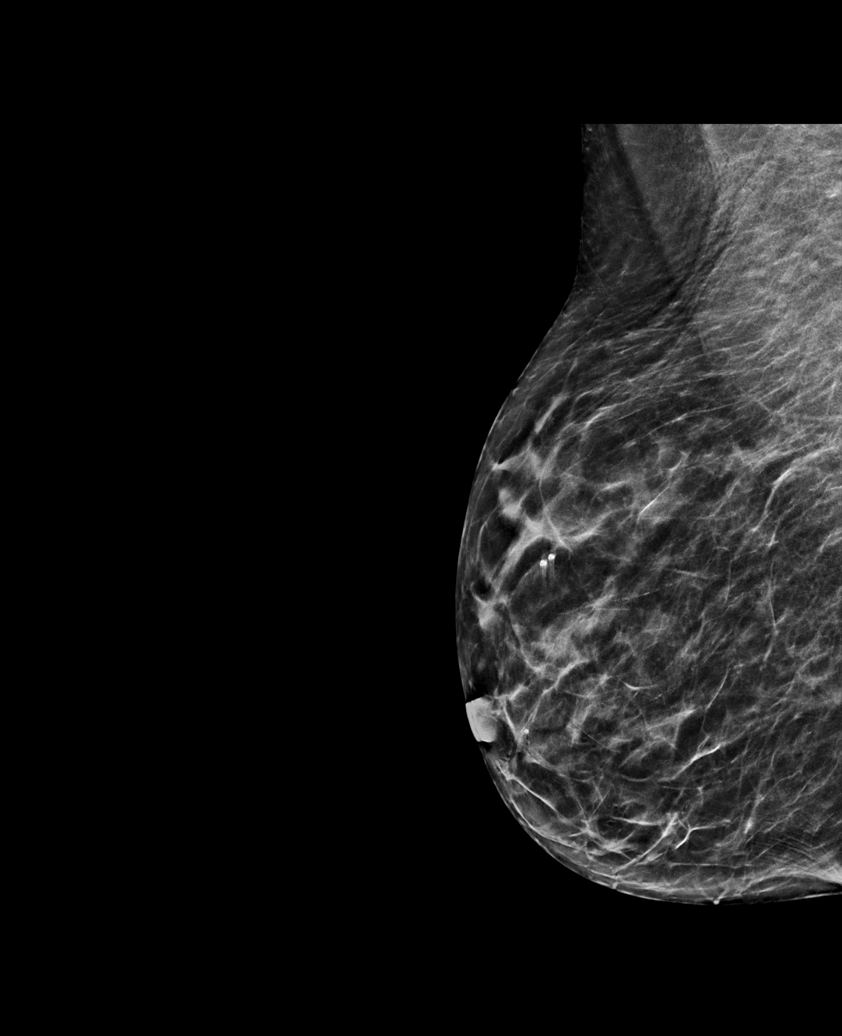

[L MLO synth-2D]
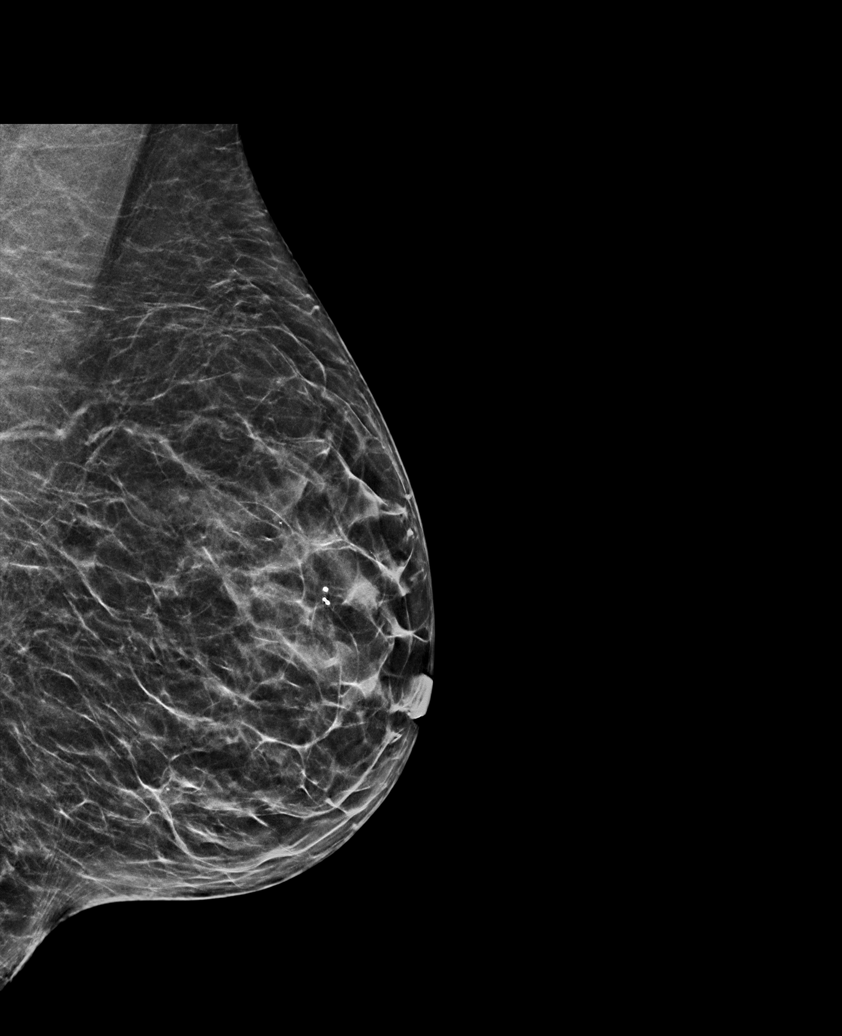

[L CC synth-2D]
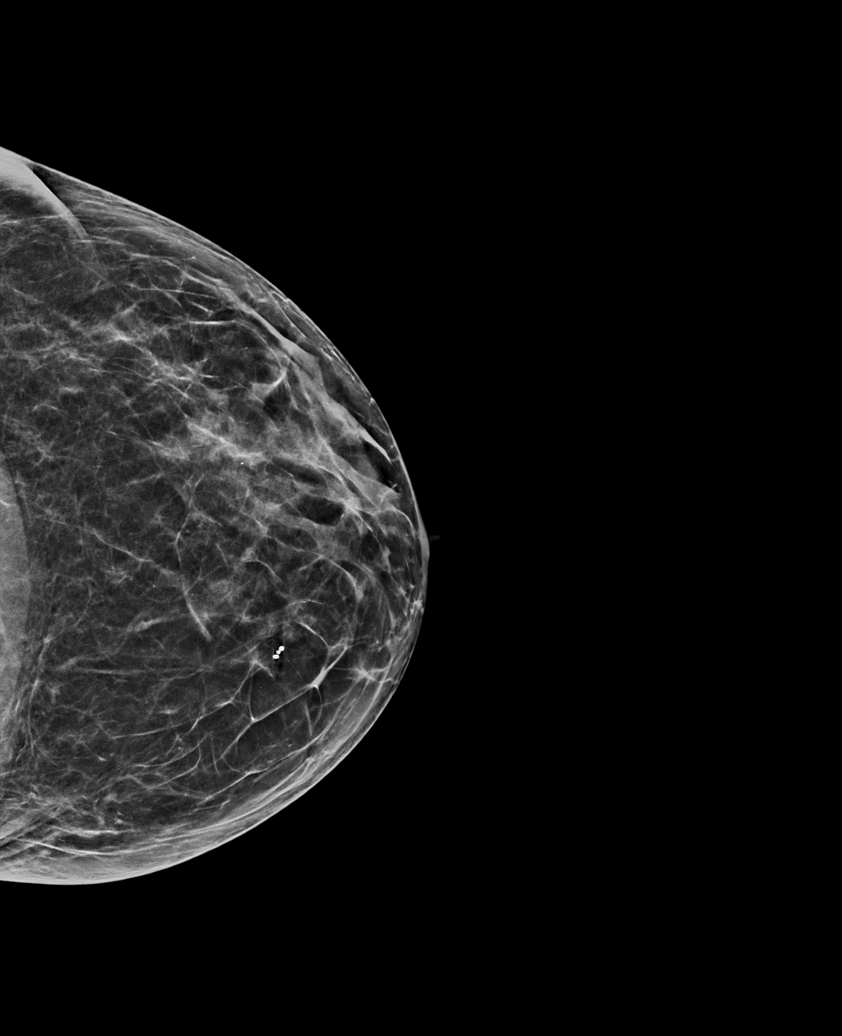

[R CC synth-2D (1 of 2)]
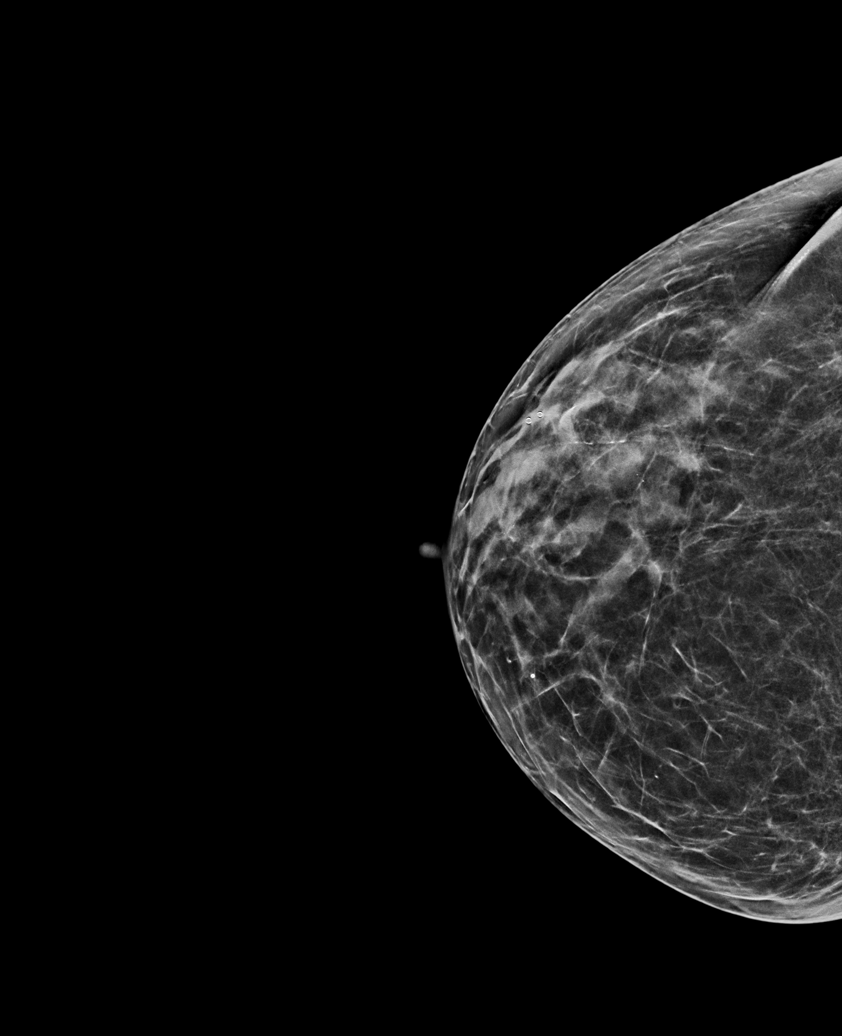

[R CC synth-2D (2 of 2)]
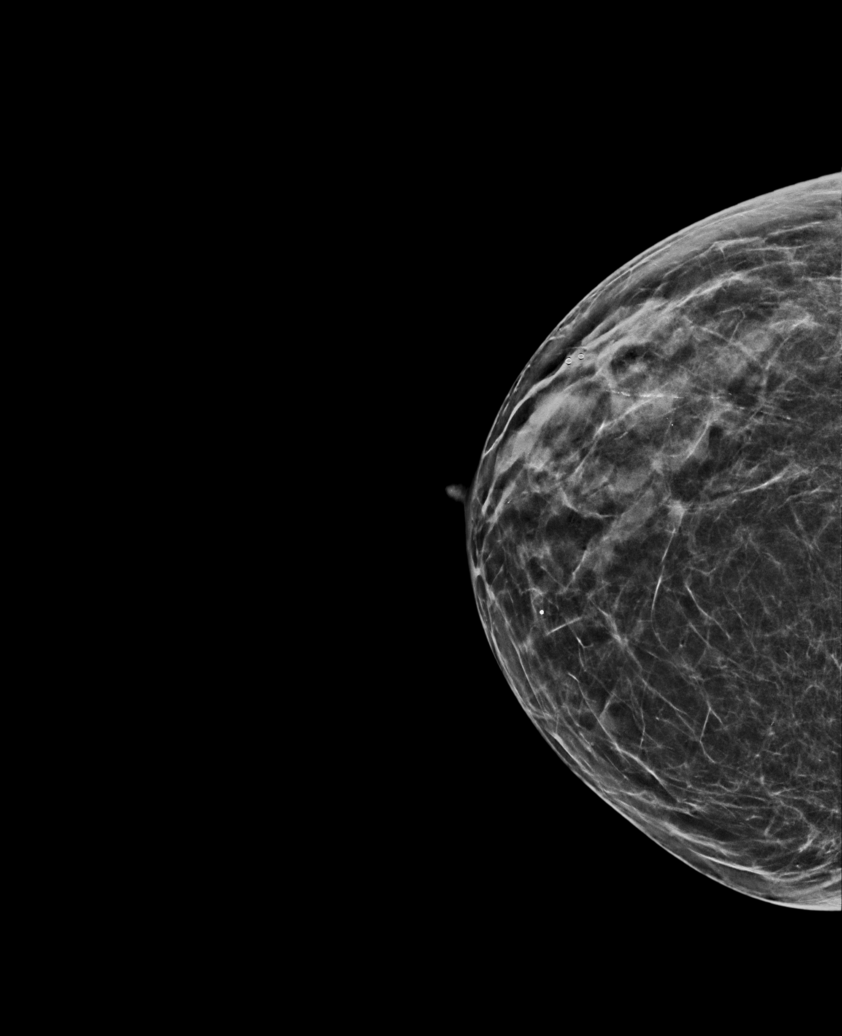

[L CC tomo · tomo slice 31/62.0]
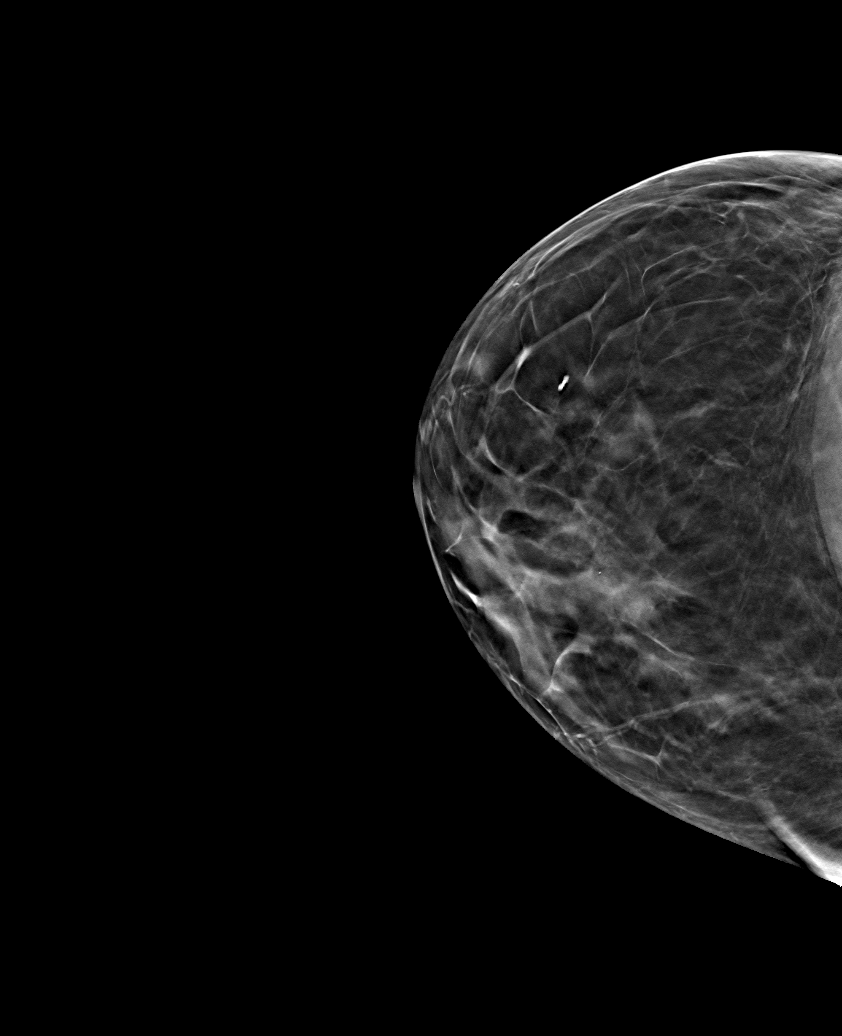

[6 of 30 positions shown; findings below may reference images not displayed]

ACR Breast Density Category c: The breast tissue is heterogeneously
dense, which may obscure small masses.
FINDINGS: There are no findings suspicious for malignancy. The images were
evaluated with computer-aided detection.
IMPRESSION: No mammographic evidence of malignancy. A result letter of this
screening mammogram will be mailed directly to the patient.

RECOMMENDATION:
Screening mammogram in one year. (Code:T4-5-GWO)

BI-RADS CATEGORY  1: Negative.

## 2022-10-25 NOTE — Telephone Encounter (Signed)
PT left message to schedule colonoscopy please return call 

## 2022-10-25 NOTE — Telephone Encounter (Signed)
Pt returned call to schedule colonoscopy ?

## 2022-10-26 ENCOUNTER — Other Ambulatory Visit: Payer: Self-pay

## 2022-10-26 ENCOUNTER — Telehealth: Payer: Self-pay

## 2022-10-26 DIAGNOSIS — Z1211 Encounter for screening for malignant neoplasm of colon: Secondary | ICD-10-CM

## 2022-10-26 MED ORDER — GOLYTELY 236 G PO SOLR: 4000.0000 mL | Freq: Once | ORAL | 0 refills | Status: AC

## 2022-10-26 NOTE — Telephone Encounter (Signed)
Gastroenterology Pre-Procedure Review  Request Date: 11/23/22 Requesting Physician: Dr. Allegra Lai  PATIENT REVIEW QUESTIONS: The patient responded to the following health history questions as indicated:    1. Are you having any GI issues? Constipation has been taking supplement to help with constipation but will be doing a 2 day prep 2. Do you have a personal history of Polyps? No polyps. Last colonoscopy was 09/03/19 recommended repeat due to poor prep 3. Do you have a family history of Colon Cancer or Polyps? no 4. Diabetes Mellitus? no 5. Joint replacements in the past 12 months?no 6. Major health problems in the past 3 months?no 7. Any artificial heart valves, MVP, or defibrillator?no    MEDICATIONS & ALLERGIES:    Patient reports the following regarding taking any anticoagulation/antiplatelet therapy:   Plavix, Coumadin, Eliquis, Xarelto, Lovenox, Pradaxa, Brilinta, or Effient? no Aspirin? yes (325mg  has been asked to decrease to 81mg  1 week before)  Patient confirms/reports the following medications:  Current Outpatient Medications  Medication Sig Dispense Refill   acetaminophen (TYLENOL) 325 MG tablet Take 650 mg by mouth every 6 (six) hours as needed for moderate pain.     aspirin 325 MG EC tablet Take 1 tablet (325 mg total) by mouth 2 (two) times daily. Then take one 81 mg aspirin once a day for three weeks. Then discontinue aspirin. 40 tablet 0   Calcium Carb-Cholecalciferol 600-400 MG-UNIT TABS Take 1 tablet by mouth daily.     denosumab (PROLIA) 60 MG/ML SOSY injection Inject 60 mg into the skin every 6 (six) months. Ordered through providers office. Do not send to pharmacy-ah     gabapentin (NEURONTIN) 300 MG capsule Take 2 capsules (600 mg total) by mouth at bedtime. For back pain 180 capsule 0   meloxicam (MOBIC) 15 MG tablet Take 1 tablet (15 mg total) by mouth daily as needed for pain. 30 tablet 1   Omega 3-6-9 Fatty Acids (OMEGA 3-6-9 COMPLEX PO) Take 1 capsule by mouth  daily.     No current facility-administered medications for this visit.    Patient confirms/reports the following allergies:  Allergies  Allergen Reactions   Cipro [Ciprofloxacin-Ciproflox Hcl Er] Hives   Morphine And Codeine Nausea And Vomiting    No orders of the defined types were placed in this encounter.    AUTHORIZATION INFORMATION Primary Insurance: 1D#: Group #:  Secondary Insurance: 1D#: Group #:  SCHEDULE INFORMATION: Date: 11/23/22 Time: Location: [facility]

## 2022-10-26 NOTE — Telephone Encounter (Signed)
Patient left a voicemail on the main line to schedule her colonoscopy

## 2022-10-26 NOTE — Telephone Encounter (Signed)
Bonnie Ramsey, patient left me a voicemail stating that she was calling to schedule her colonoscopy. Can you please call her back to schedule it. Thank you.

## 2022-11-15 ENCOUNTER — Ambulatory Visit
Admission: RE | Admit: 2022-11-15 | Discharge: 2022-11-15 | Disposition: A | Discharge status: Home / Self Care | Payer: Medicare Other | Source: Ambulatory Visit | Attending: Primary Care | Admitting: Primary Care

## 2022-11-15 DIAGNOSIS — E2839 Other primary ovarian failure: Secondary | ICD-10-CM | POA: Insufficient documentation

## 2022-11-15 DIAGNOSIS — Z1231 Encounter for screening mammogram for malignant neoplasm of breast: Secondary | ICD-10-CM | POA: Diagnosis present

## 2022-11-22 ENCOUNTER — Encounter: Payer: Self-pay | Admitting: Gastroenterology

## 2022-11-22 NOTE — Telephone Encounter (Signed)
Bonnie Ramsey, can you coordinate her Prolia injection before she leaves town in September?

## 2022-11-23 ENCOUNTER — Encounter
Admission: RE | Disposition: A | Discharge status: Home / Self Care | Payer: Self-pay | Source: Home / Self Care | Attending: Gastroenterology

## 2022-11-23 ENCOUNTER — Ambulatory Visit: Payer: Medicare Other | Admitting: Anesthesiology

## 2022-11-23 ENCOUNTER — Encounter: Payer: Self-pay | Admitting: Gastroenterology

## 2022-11-23 ENCOUNTER — Ambulatory Visit
Admission: RE | Admit: 2022-11-23 | Discharge: 2022-11-23 | Disposition: A | Discharge status: Home / Self Care | Payer: Medicare Other | Attending: Gastroenterology | Admitting: Gastroenterology

## 2022-11-23 ENCOUNTER — Telehealth: Payer: Self-pay

## 2022-11-23 DIAGNOSIS — K648 Other hemorrhoids: Secondary | ICD-10-CM | POA: Diagnosis not present

## 2022-11-23 DIAGNOSIS — K644 Residual hemorrhoidal skin tags: Secondary | ICD-10-CM | POA: Diagnosis not present

## 2022-11-23 DIAGNOSIS — Z85828 Personal history of other malignant neoplasm of skin: Secondary | ICD-10-CM | POA: Diagnosis not present

## 2022-11-23 DIAGNOSIS — K6289 Other specified diseases of anus and rectum: Secondary | ICD-10-CM | POA: Insufficient documentation

## 2022-11-23 DIAGNOSIS — Z1211 Encounter for screening for malignant neoplasm of colon: Secondary | ICD-10-CM | POA: Diagnosis present

## 2022-11-23 HISTORY — PX: COLONOSCOPY WITH PROPOFOL: SHX5780

## 2022-11-23 HISTORY — PX: BIOPSY: SHX5522

## 2022-11-23 SURGERY — COLONOSCOPY WITH PROPOFOL
Anesthesia: General

## 2022-11-23 MED ORDER — LIDOCAINE HCL (CARDIAC) PF 100 MG/5ML IV SOSY
PREFILLED_SYRINGE | INTRAVENOUS | Status: DC | PRN
  Administered 2022-11-23: 100 mg via INTRAVENOUS

## 2022-11-23 MED ORDER — STERILE WATER FOR IRRIGATION IR SOLN
Status: DC | PRN
  Administered 2022-11-23: 60 mL

## 2022-11-23 MED ORDER — LIDOCAINE HCL (PF) 2 % IJ SOLN
INTRAMUSCULAR | Status: AC
  Filled 2022-11-23: qty 5

## 2022-11-23 MED ORDER — PROPOFOL 10 MG/ML IV BOLUS
INTRAVENOUS | Status: DC | PRN
  Administered 2022-11-23: 150 ug/kg/min via INTRAVENOUS
  Administered 2022-11-23: 50 mg via INTRAVENOUS
  Administered 2022-11-23: 100 mg via INTRAVENOUS
  Administered 2022-11-23: 40 mg via INTRAVENOUS

## 2022-11-23 MED ORDER — SODIUM CHLORIDE 0.9 % IV SOLN: INTRAVENOUS | Status: DC

## 2022-11-23 NOTE — Telephone Encounter (Signed)
 Prolia VOB initiated via [email or web address]  Last Prolia inj: 03/27/22 Next Prolia inj DUE: NOW

## 2022-11-23 NOTE — Anesthesia Postprocedure Evaluation (Signed)
Anesthesia Post Note  Patient: Bonnie Ramsey  Procedure(s) Performed: COLONOSCOPY WITH PROPOFOL BIOPSY  Patient location during evaluation: Endoscopy Anesthesia Type: General Level of consciousness: awake and alert Pain management: pain level controlled Vital Signs Assessment: post-procedure vital signs reviewed and stable Respiratory status: spontaneous breathing, nonlabored ventilation, respiratory function stable and patient connected to nasal cannula oxygen Cardiovascular status: blood pressure returned to baseline and stable Postop Assessment: no apparent nausea or vomiting Anesthetic complications: no   No notable events documented.   Last Vitals:  Vitals:   11/23/22 0707 11/23/22 0845  BP: (!) 142/90 104/67  Pulse: 71 64  Resp: 18   Temp: (!) 36.4 C   SpO2: 100% 100%    Last Pain:  Vitals:   11/23/22 0845  TempSrc:   PainSc: 0-No pain                 Louie Boston

## 2022-11-23 NOTE — H&P (Signed)
Arlyss Repress, MD [address]  [address]  [place], [place] [address]  Main: [phone]  Fax: [phone] Pager: [phone]  Primary Care Physician:  Doreene Nest, NP Primary Gastroenterologist:  Dr. Arlyss Repress  Pre-Procedure History & Physical: HPI:  Bonnie Ramsey is a 72 y.o. female is here for an colonoscopy.   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    right Chest treated with excision     Past Surgical History:  Procedure Laterality Date   BACK SURGERY     BREAST CYST ASPIRATION     CATARACT EXTRACTION Bilateral 06/2022   COLONOSCOPY WITH PROPOFOL N/A 09/03/2019   Procedure: COLONOSCOPY WITH PROPOFOL;  Surgeon: Pasty Spillers, MD;  Location: [facility];  Service: Endoscopy;  Laterality: N/A;   EYE SURGERY     HAND SURGERY Left    JOINT REPLACEMENT     SPINAL FUSION  2015, 2016, 2017   Lumbar   TOTAL KNEE REVISION Left 05/03/2021   Procedure: TOTAL KNEE REVISION;  Surgeon: Ollen Gross, MD;  Location: [facility];  Service: Orthopedics;  Laterality: Left;    Prior to Admission medications   Medication Sig Start Date End Date Taking? Authorizing Provider  Calcium Carb-Cholecalciferol 600-400 MG-UNIT TABS Take 1 tablet by mouth daily. 10/13/15  Yes [provider]  denosumab (PROLIA) 60 MG/ML SOSY injection Inject 60 mg into the skin every 6 (six) months. Ordered through providers office. Do not send to pharmacy-ah   Yes [provider]  gabapentin (NEURONTIN) 300 MG capsule Take 2 capsules (600 mg total) by mouth at bedtime. For back pain 08/20/22  Yes Doreene Nest, NP  Omega 3-6-9 Fatty Acids (OMEGA 3-6-9 COMPLEX PO) Take 1 capsule by mouth daily.   Yes [provider]  acetaminophen (TYLENOL) 325 MG tablet Take 650 mg by mouth every 6  (six) hours as needed for moderate pain. 11/18/14   [provider]  aspirin 325 MG EC tablet Take 1 tablet (325 mg total) by mouth 2 (two) times daily. Then take one 81 mg aspirin once a day for three weeks. Then discontinue aspirin. 05/04/21   Nelia Shi D, PA-C  meloxicam (MOBIC) 15 MG tablet Take 1 tablet (15 mg total) by mouth daily as needed for pain. Patient not taking: Reported on 10/26/2022 06/28/21   Felecia Shelling, DPM    Allergies as of 10/26/2022 - Review Complete 10/26/2022  Allergen Reaction Noted   Cipro [ciprofloxacin-ciproflox hcl er] Hives 09/03/2019   Morphine and codeine Nausea And Vomiting 09/03/2019    Family History  Problem Relation Age of Onset   Breast cancer Mother 67    Social History   Socioeconomic History   Marital status: Single    Spouse name: Not on file   Number of children: Not on file   Years of education: Not on file   Highest education level: Not on file  Occupational History   Not on file  Tobacco Use   Smoking status: Never   Smokeless tobacco: Never  Vaping Use   Vaping status: Never Used  Substance and Sexual Activity   Alcohol use: Yes    Comment: 2 per month   Drug use: Never   Sexual activity: Not on file  Other  Topics Concern   Not on file  Social History Narrative   Not on file   Social Determinants of Health   Financial Resource Strain: Low Risk  (02/16/2022)   Overall Financial Resource Strain (CARDIA)    Difficulty of Paying Living Expenses: Not hard at all  Food Insecurity: No Food Insecurity (02/16/2022)   Hunger Vital Sign    Worried About Running Out of Food in the Last Year: Never true    Ran Out of Food in the Last Year: Never true  Transportation Needs: No Transportation Needs (02/16/2022)   PRAPARE - Administrator, Civil Service (Medical): No    Lack of Transportation (Non-Medical): No  Physical Activity: Sufficiently Active (02/16/2022)   Exercise Vital Sign    Days of Exercise per  Week: 5 days    Minutes of Exercise per Session: 90 min  Stress: No Stress Concern Present (02/16/2022)   [employer] of Occupational Health - Occupational Stress Questionnaire    Feeling of Stress : Not at all  Social Connections: Moderately Integrated (02/16/2022)   Social Connection and Isolation Panel [NHANES]    Frequency of Communication with Friends and Family: More than three times a week    Frequency of Social Gatherings with Friends and Family: More than three times a week    Attends Religious Services: More than 4 times per year    Active Member of [employer] or Organizations: Yes    Attends Banker Meetings: More than 4 times per year    Marital Status: Never married  Intimate Partner Violence: Not At Risk (02/16/2022)   Humiliation, Afraid, Rape, and Kick questionnaire    Fear of Current or Ex-Partner: No    Emotionally Abused: No    Physically Abused: No    Sexually Abused: No    Review of Systems: See HPI, otherwise negative ROS  Physical Exam: BP (!) 142/90   Pulse 71   Temp (!) 97.5 F (36.4 C) (Temporal)   Resp 18   Ht 5' 4.5" (1.638 m)   Wt 60.5 kg   SpO2 100%   BMI 22.52 kg/m  General:   Alert,  pleasant and cooperative in NAD Head:  Normocephalic and atraumatic. Neck:  Supple; no masses or thyromegaly. Lungs:  Clear throughout to auscultation.    Heart:  Regular rate and rhythm. Abdomen:  Soft, nontender and nondistended. Normal bowel sounds, without guarding, and without rebound.   Neurologic:  Alert and  oriented x4;  grossly normal neurologically.  Impression/Plan: Bonnie Ramsey is here for an colonoscopy to be performed for colon cancer screening  Risks, benefits, limitations, and alternatives regarding  colonoscopy have been reviewed with the patient.  Questions have been answered.  All parties agreeable.   Lannette Donath, MD  11/23/2022, 8:13 AM

## 2022-11-23 NOTE — Anesthesia Preprocedure Evaluation (Signed)
Anesthesia Evaluation  Patient identified by MRN, date of birth, ID band Patient awake    Reviewed: Allergy & Precautions, NPO status , Patient's Chart, lab work & pertinent test results  History of Anesthesia Complications Negative for: history of anesthetic complications  Airway Mallampati: II  TM Distance: >3 FB Neck ROM: full    Dental no notable dental hx.    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, right     Comment:  treated with excision  No date: 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     Comment:  right Chest treated with excision   Past Surgical History: No date: BACK SURGERY No date: BREAST CYST ASPIRATION 06/2022: CATARACT EXTRACTION; Bilateral 09/03/2019: COLONOSCOPY WITH PROPOFOL; N/A     Comment:  Procedure: COLONOSCOPY WITH PROPOFOL;  Surgeon:               Pasty Spillers, MD;  Location: [facility];                Service: Endoscopy;  Laterality: N/A; No date: EYE SURGERY No date: HAND SURGERY; Left No date: JOINT REPLACEMENT 2015, 2016, 2017: SPINAL FUSION     Comment:  Lumbar 05/03/2021: TOTAL KNEE REVISION; Left     Comment:  Procedure: TOTAL KNEE REVISION;  Surgeon: Ollen Gross, MD;  Location: [facility];  Service: Orthopedics;                Laterality: Left;  BMI    Body Mass Index: 22.52 kg/m      Reproductive/Obstetrics negative OB  ROS                             Anesthesia Physical Anesthesia Plan  ASA: 2  Anesthesia Plan: General   Post-op Pain Management: Minimal or no pain anticipated   Induction: Intravenous  PONV Risk Score and Plan: 2 and Propofol infusion and TIVA  Airway Management Planned: Natural Airway and Nasal Cannula  Additional Equipment:   Intra-op Plan:   Post-operative Plan:   Informed Consent: I have reviewed the patients History and Physical, chart, labs and discussed the procedure including the risks, benefits and alternatives for the proposed anesthesia with the patient or authorized representative who has indicated his/her understanding and acceptance.     Dental Advisory Given  Plan Discussed with: Anesthesiologist, CRNA and Surgeon  Anesthesia Plan Comments: (Patient consented for risks of anesthesia including but not limited to:  - adverse reactions to medications - risk of airway placement if required - damage to eyes, teeth, lips or other oral mucosa - nerve damage due to positioning  - sore throat or hoarseness - Damage to heart, brain, nerves, lungs, other parts of body or loss of life  Patient voiced understanding.)       Anesthesia Quick Evaluation

## 2022-11-23 NOTE — Transfer of Care (Signed)
Immediate Anesthesia Transfer of Care Note  Patient: Bonnie Ramsey  Procedure(s) Performed: COLONOSCOPY WITH PROPOFOL BIOPSY  Patient Location: PACU  Anesthesia Type:General  Level of Consciousness: awake, alert , and oriented  Airway & Oxygen Therapy: Patient Spontanous Breathing  Post-op Assessment: Report given to RN and Post -op Vital signs reviewed and stable  Post vital signs: Reviewed and stable  Last Vitals:  Vitals Value Taken Time  BP 104/67 11/23/22 0845  Temp 35.9 0845  Pulse 64 11/23/22 0846  Resp 17 0845  SpO2 99 % 11/23/22 0846  Vitals shown include unfiled device data.  Last Pain:  Vitals:   11/23/22 0845  TempSrc:   PainSc: 0-No pain         Complications: No notable events documented.

## 2022-11-23 NOTE — Op Note (Signed)
[facility] Gastroenterology Patient Name: Anahid Muser Procedure Date: 11/23/2022 8:13 AM MRN: [id number] Account #: [id number] Date of Birth: [birth year] Admit Type: Outpatient Age: 72 Room: [facility] ENDO ROOM 2 Gender: Female Note Status: Finalized Instrument Name: Peds Colonoscope [id number] Procedure:             Colonoscopy Indications:           Screening for colorectal malignant neoplasm, Screening                         for colorectal malignant neoplasm, inadequate bowel                         prep on last colonoscopy (more recent than 10 years                         ago), Last colonoscopy: May 2021 Providers:             Toney Reil MD, MD Referring MD:          Doreene Nest (Referring MD) Medicines:             General Anesthesia Complications:         No immediate complications. Estimated blood loss: None. Procedure:             Pre-Anesthesia Assessment:                        - Prior to the procedure, a History and Physical was                         performed, and patient medications and allergies were                         reviewed. The patient is competent. The risks and                         benefits of the procedure and the sedation options and                         risks were discussed with the patient. All questions                         were answered and informed consent was obtained.                         Patient identification and proposed procedure were                         verified by the physician, the nurse, the                         anesthesiologist, the anesthetist and the technician                         in the pre-procedure area in the procedure room in the                         endoscopy suite. Mental Status Examination: alert and  oriented. Airway Examination: normal oropharyngeal                         airway and neck mobility. Respiratory Examination:                          clear to auscultation. CV Examination: normal.                         Prophylactic Antibiotics: The patient does not require                         prophylactic antibiotics. Prior Anticoagulants: The                         patient has taken no anticoagulant or antiplatelet                         agents. ASA Grade Assessment: II - A patient with mild                         systemic disease. After reviewing the risks and                         benefits, the patient was deemed in satisfactory                         condition to undergo the procedure. The anesthesia                         plan was to use general anesthesia. Immediately prior                         to administration of medications, the patient was                         re-assessed for adequacy to receive sedatives. The                         heart rate, respiratory rate, oxygen saturations,                         blood pressure, adequacy of pulmonary ventilation, and                         response to care were monitored throughout the                         procedure. The physical status of the patient was                         re-assessed after the procedure.                        After obtaining informed consent, the colonoscope was                         passed under direct vision. Throughout the procedure,  the patient's blood pressure, pulse, and oxygen                         saturations were monitored continuously. The                         Colonoscope was introduced through the anus and                         advanced to the the cecum, identified by appendiceal                         orifice and ileocecal valve. The colonoscopy was                         performed without difficulty. The patient tolerated                         the procedure well. The quality of the bowel                         preparation was evaluated using the BBPS [facility] Bowel                          Preparation Scale) with scores of: Right Colon = 3,                         Transverse Colon = 3 and Left Colon = 3 (entire mucosa                         seen well with no residual staining, small fragments                         of stool or opaque liquid). The total BBPS score                         equals 9. The ileocecal valve, appendiceal orifice,                         and rectum were photographed. Findings:      The perianal and digital rectal examinations were normal. Pertinent       negatives include normal sphincter tone and no palpable rectal lesions.      A diffuse area of mildly erythematous mucosa was found in the distal       rectum. Biopsies were taken with a cold forceps for histology.      Non-bleeding external and internal hemorrhoids were found during       retroflexion. The hemorrhoids were medium-sized. Impression:            - Erythematous mucosa in the distal rectum. Biopsied.                        - Non-bleeding external and internal hemorrhoids. Recommendation:        - Discharge patient to home (with escort).                        - Resume previous diet today.                        -  Continue present medications.                        - Await pathology results.                        - Repeat colonoscopy in 10 years for screening                         purposes. Procedure Code(s):     --- Professional ---                        [phone], Colonoscopy, flexible; with biopsy, single or                         multiple Diagnosis Code(s):     --- Professional ---                        K62.89, Other specified diseases of anus and rectum                        K64.8, Other hemorrhoids                        Z12.11, Encounter for screening for malignant neoplasm                         of colon CPT copyright 2022 American Medical Association. All rights reserved. The codes documented in this report are preliminary and upon coder review may  be revised to meet  current compliance requirements. Dr. Libby Maw Toney Reil MD, MD 11/23/2022 8:44:08 AM This report has been signed electronically. Number of Addenda: 0 Note Initiated On: 11/23/2022 8:13 AM Scope Withdrawal Time: 0 hours 11 minutes 35 seconds  Total Procedure Duration: 0 hours 16 minutes 15 seconds  Estimated Blood Loss:  Estimated blood loss: none.      [facility]

## 2022-11-26 ENCOUNTER — Encounter: Payer: Self-pay | Admitting: Gastroenterology

## 2022-11-26 NOTE — Telephone Encounter (Signed)
Pt ready for scheduling for PROLIA on or after : 11/26/22  Out-of-pocket cost due at time of visit: $0  Primary: MEDICARE Prolia co-insurance: 0% Admin fee co-insurance: 0%  Secondary: TRICARE Prolia co-insurance:  Admin fee co-insurance:   Medical Benefit Details: Date Benefits were checked: 11/23/22 Deductible: $240 MET OF $240 REQUIRED/ Coinsurance: 0%/ Admin Fee: 0%  Prior Auth: N/A PA# Expiration Date:   # of doses approved:  Pharmacy benefit: Copay $--- If patient wants fill through the pharmacy benefit please send prescription to:  --- , and include estimated need by date in rx notes. Pharmacy will ship medication directly to the office.  Patient NOT eligible for Prolia Copay Card. Copay Card can make patient's cost as little as $25. Link to apply: [email or web address]://www.amgensupportplus.com/copay  ** This summary of benefits is an estimation of the patient's out-of-pocket cost. Exact cost may very based on individual plan coverage.

## 2022-12-03 ENCOUNTER — Other Ambulatory Visit: Payer: Self-pay

## 2022-12-03 DIAGNOSIS — M810 Age-related osteoporosis without current pathological fracture: Secondary | ICD-10-CM

## 2022-12-03 NOTE — Telephone Encounter (Signed)
Called patient reviewed all following information including appointment, Co pay due at time of visit and if pick up of injection is needed from outside pharmacy.     Out of pocket for patient:  $0  Lab appointment :  12/14/22  Nurse visit:  12/18/22  Lab order placed: Yes  Prolia has been  [x]   Ordered supply on hand   []   Script sent to local pharmacy for patient to bring   []   Script sent to Specialty pharmacy   []   Script sent to [employer] to deliver

## 2022-12-14 ENCOUNTER — Other Ambulatory Visit (INDEPENDENT_AMBULATORY_CARE_PROVIDER_SITE_OTHER): Payer: Medicare Other

## 2022-12-14 DIAGNOSIS — M810 Age-related osteoporosis without current pathological fracture: Secondary | ICD-10-CM

## 2022-12-14 LAB — BASIC METABOLIC PANEL
BUN: 12 mg/dL (ref 6–23)
CO2: 29 mEq/L (ref 19–32)
Calcium: 10.2 mg/dL (ref 8.4–10.5)
Chloride: 98 mEq/L (ref 96–112)
Creatinine, Ser: 0.78 mg/dL (ref 0.40–1.20)
GFR: 75.36 mL/min (ref >60.00)
Glucose, Bld: 103 mg/dL — ABNORMAL HIGH (ref 70–99)
Potassium: 4.1 mEq/L (ref 3.5–5.1)
Sodium: 136 mEq/L (ref 135–145)

## 2022-12-18 ENCOUNTER — Ambulatory Visit (INDEPENDENT_AMBULATORY_CARE_PROVIDER_SITE_OTHER): Payer: Medicare Other

## 2022-12-18 DIAGNOSIS — M810 Age-related osteoporosis without current pathological fracture: Secondary | ICD-10-CM

## 2022-12-18 MED ORDER — DENOSUMAB 60 MG/ML ~~LOC~~ SOSY
60.0000 mg | PREFILLED_SYRINGE | Freq: Once | SUBCUTANEOUS | Status: AC
Start: 2022-12-18 — End: 2022-12-18
  Administered 2022-12-18: 60 mg via SUBCUTANEOUS

## 2022-12-18 NOTE — Progress Notes (Signed)
Per orders of Mayra Reel, DPN AGNP-C, injection of Prolia  given by Donnamarie Poag in left Arm. Patient tolerated injection well. Patient will be contacted to make appointment in 6 months.

## 2022-12-24 ENCOUNTER — Telehealth: Payer: Self-pay | Admitting: Primary Care

## 2022-12-24 DIAGNOSIS — G8929 Other chronic pain: Secondary | ICD-10-CM

## 2022-12-24 MED ORDER — GABAPENTIN 300 MG PO CAPS
600.0000 mg | ORAL_CAPSULE | Freq: Every day | ORAL | 2 refills | Status: DC
Start: 2022-12-24 — End: 2024-02-11

## 2022-12-24 NOTE — Telephone Encounter (Signed)
Prescription Request  12/24/2022  LOV: 10/23/2022  What is the name of the medication or equipment?  gabapentin (NEURONTIN) 300 MG capsule  Have you contacted your pharmacy to request a refill? No    Which pharmacy would you like this sent to?  [id number] - [place], [place] - [address] AT [facility] [address] [place] [address] Phone: [phone] Fax: [phone]    Patient notified that their request is being sent to the clinical staff for review and that they should receive a response within 2 business days.   Please advise at [facility] [phone]

## 2022-12-24 NOTE — Telephone Encounter (Signed)
Refills sent to pharmacy. 

## 2023-02-28 ENCOUNTER — Ambulatory Visit: Payer: Medicare Other | Admitting: Primary Care

## 2023-04-01 ENCOUNTER — Ambulatory Visit (INDEPENDENT_AMBULATORY_CARE_PROVIDER_SITE_OTHER): Payer: Medicare Other

## 2023-04-01 VITALS — Ht 65.0 in | Wt 133.0 lb

## 2023-04-01 DIAGNOSIS — Z0000 Encounter for general adult medical examination without abnormal findings: Secondary | ICD-10-CM

## 2023-04-01 NOTE — Progress Notes (Signed)
Subjective:   Bonnie Ramsey is a 72 y.o. female who presents for Medicare Annual (Subsequent) preventive examination.  Visit Complete: Virtual I connected with  Fantazia Clure Yackley on 04/01/23 by a audio enabled telemedicine application and verified that I am speaking with the correct person using two identifiers.  Patient Location: Home  Provider Location: Office/Clinic  I discussed the limitations of evaluation and management by telemedicine. The patient expressed understanding and agreed to proceed.  Vital Signs: Because this visit was a virtual/telehealth visit, some criteria may be missing or patient reported. Any vitals not documented were not able to be obtained and vitals that have been documented are patient reported.  Patient Medicare AWV questionnaire was completed by the patient on 9not done); I have confirmed that all information answered by patient is correct and no changes since this date.  Cardiac Risk Factors include: advanced age (>44men, >95 women);dyslipidemia     Objective:    Today's Vitals   04/01/23 1343  Weight: 133 lb (60.3 kg)  Height: 5\' 5"  (1.651 m)   Body mass index is 22.13 kg/m.     04/01/2023    1:46 PM 11/23/2022    7:12 AM 02/16/2022    9:17 AM 05/03/2021    6:04 PM 04/24/2021   10:03 AM 07/15/2020    9:57 AM 09/06/2019    7:53 AM  Advanced Directives  Does Patient Have a Medical Advance Directive? Yes Yes Yes Yes Yes Yes Yes  Type of Estate agent of [place];Living will Healthcare Power of [place];Living will Healthcare Power of [place];Living will Healthcare Power of [place];Living will Healthcare Power of [place];Living will Healthcare Power of [place];Living will Living will  Does patient want to make changes to medical advance directive?    No - Patient declined   No - Patient declined  Copy of Healthcare Power of Attorney in Chart? No - copy requested  No - copy requested   No - copy requested     Current  Medications (verified) Outpatient Encounter Medications as of 04/01/2023  Medication Sig   acetaminophen (TYLENOL) 325 MG tablet Take 650 mg by mouth every 6 (six) hours as needed for moderate pain.   Calcium Carb-Cholecalciferol 600-400 MG-UNIT TABS Take 1 tablet by mouth daily.   denosumab (PROLIA) 60 MG/ML SOSY injection Inject 60 mg into the skin every 6 (six) months. Ordered through providers office. Do not send to pharmacy-ah   gabapentin (NEURONTIN) 300 MG capsule Take 2 capsules (600 mg total) by mouth at bedtime. For back pain   Omega 3-6-9 Fatty Acids (OMEGA 3-6-9 COMPLEX PO) Take 1 capsule by mouth daily.   No facility-administered encounter medications on file as of 04/01/2023.   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    right Chest treated with excision    Past Surgical History:  Procedure Laterality Date   BACK SURGERY     BIOPSY  11/23/2022   Procedure: BIOPSY;  Surgeon: Toney Reil, MD;  Location: [facility] ENDOSCOPY;  Service: Gastroenterology;;   BREAST CYST ASPIRATION     CATARACT EXTRACTION Bilateral 06/2022   COLONOSCOPY WITH PROPOFOL N/A 09/03/2019   Procedure: COLONOSCOPY WITH PROPOFOL;  Surgeon: Pasty Spillers,  MD;  Location: [facility] ENDOSCOPY;  Service: Endoscopy;  Laterality: N/A;   COLONOSCOPY WITH PROPOFOL N/A 11/23/2022   Procedure: COLONOSCOPY WITH PROPOFOL;  Surgeon: Toney Reil, MD;  Location: [facility] ENDOSCOPY;  Service: Gastroenterology;  Laterality: N/A;   EYE SURGERY     HAND SURGERY Left    JOINT REPLACEMENT     SPINAL FUSION  2015, 2016, 2017   Lumbar   TOTAL KNEE REVISION Left 05/03/2021   Procedure: TOTAL KNEE REVISION;  Surgeon: Ollen Gross, MD;  Location: [facility]  ORS;  Service: Orthopedics;  Laterality: Left;   Family History  Problem Relation Age of Onset   Breast cancer Mother 20   Social History   Socioeconomic History   Marital status: Single    Spouse name: Not on file   Number of children: Not on file   Years of education: Not on file   Highest education level: Not on file  Occupational History   Not on file  Tobacco Use   Smoking status: Never   Smokeless tobacco: Never  Vaping Use   Vaping status: Never Used  Substance and Sexual Activity   Alcohol use: Yes    Comment: 2 per month   Drug use: Never   Sexual activity: Not on file  Other Topics Concern   Not on file  Social History Narrative   Not on file   Social Drivers of Health   Financial Resource Strain: Low Risk  (04/01/2023)   Overall Financial Resource Strain (CARDIA)    Difficulty of Paying Living Expenses: Not hard at all  Food Insecurity: No Food Insecurity (04/01/2023)   Hunger Vital Sign    Worried About Running Out of Food in the Last Year: Never true    Ran Out of Food in the Last Year: Never true  Transportation Needs: No Transportation Needs (04/01/2023)   PRAPARE - Administrator, Civil Service (Medical): No    Lack of Transportation (Non-Medical): No  Physical Activity: Sufficiently Active (04/01/2023)   Exercise Vital Sign    Days of Exercise per Week: 5 days    Minutes of Exercise per Session: 90 min  Stress: No Stress Concern Present (04/01/2023)   [employer] of Occupational Health - Occupational Stress Questionnaire    Feeling of Stress : Not at all  Social Connections: Socially Integrated (04/01/2023)   Social Connection and Isolation Panel [NHANES]    Frequency of Communication with Friends and Family: More than three times a week    Frequency of Social Gatherings with Friends and Family: More than three times a week    Attends Religious Services: More than 4 times per year    Active Member of [employer] or Organizations: Yes     Attends Engineer, structural: More than 4 times per year    Marital Status: Married    Tobacco Counseling Counseling given: Not Answered  Clinical Intake:  Pre-visit preparation completed: No  Pain : No/denies pain   BMI - recorded: 22.13 Nutritional Status: BMI of 19-24  Normal Nutritional Risks: None Diabetes: No  How often do you need to have someone help you when you read instructions, pamphlets, or other written materials from your doctor or pharmacy?: 1 - Never  Interpreter Needed?: No  Comments: lives with husband Information entered by :: B.Meshilem Machuca,LPN   Activities of Daily Living    04/01/2023    1:47 PM  In your present state of health, do you have any difficulty performing the following activities:  Hearing? 1  Vision? 0  Difficulty concentrating or making decisions? 0  Walking or climbing stairs? 0  Dressing or bathing? 0  Doing errands, shopping? 0  Preparing Food and eating ? N  Using the Toilet? N  In the past six months, have you accidently leaked urine? N  Do you have problems with loss of bowel control? N  Managing your Medications? N  Managing your Finances? N  Housekeeping or managing your Housekeeping? N    Patient Care Team: Doreene Nest, NP as PCP - General (Internal Medicine) Dasher, Cliffton Asters, MD (Dermatology) Ollen Gross, MD as Consulting Physician (Orthopedic Surgery)  Indicate any recent Medical Services you may have received from other than [facility] providers in the past year (date may be approximate).     Assessment:   This is a routine wellness examination for Bonnie Ramsey.  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       Depression Screen    04/01/2023    1:40 PM 10/23/2022   11:30 AM 02/16/2022    9:12 AM 10/10/2021   12:01 PM 02/17/2021   10:54 AM 07/15/2020    9:58 AM  PHQ 2/9 Scores  PHQ - 2 Score 0 0 0 0 0 4  PHQ- 9 Score    0  5    Fall Risk    04/01/2023    1:46 PM 10/23/2022   11:30 AM 02/16/2022    9:15 AM 10/10/2021   12:01 PM 07/15/2020    9:57 AM  Fall Risk   Falls in the past year? 0 0 0 0 0  Number falls in past yr: 0 0 0 0 0  Injury with Fall? 0 0 0 0 0  Risk for fall due to : No Fall Risks No Fall Risks No Fall Risks  No Fall Risks  Follow up Education provided;Falls prevention discussed Falls evaluation completed Falls prevention discussed Falls evaluation completed Falls evaluation completed;Falls prevention discussed    MEDICARE RISK AT HOME: Medicare Risk at Home Any stairs in or around the home?: No If so, are there any without handrails?: No Home free of loose throw rugs in walkways, pet beds, electrical cords, etc?: Yes Adequate lighting in your home to reduce risk of falls?: Yes Life alert?: No Use of a cane, walker or w/c?: No Grab bars in the bathroom?: Yes Shower chair or bench in shower?: Yes Elevated toilet seat or a handicapped toilet?: Yes  TIMED UP AND GO:  Was the test performed?  No    Cognitive Function:    07/15/2020   10:18 AM  MMSE - Mini Mental State Exam  Not completed: Unable to complete        04/01/2023    1:48 PM 02/16/2022    9:17 AM  6CIT Screen  What Year? 0 points 0 points  What month? 0 points 0 points  What time? 0 points 0 points  Count back from 20 0 points 0 points  Months in reverse 0 points 0 points  Repeat phrase 0 points 0 points  Total Score 0 points 0 points    Immunizations Immunization History  Administered Date(s) Administered   Fluad Quad(high Dose 65+) 02/17/2021   Influenza, Seasonal, Injecte, Preservative Fre 01/15/2015   Influenza,inj,Quad PF,6+  Mos 01/19/2016   Influenza-Unspecified 02/15/2008, 01/23/2018, 01/15/2020   Moderna Sars-Covid-2 Vaccination 05/11/2019, 06/08/2019, 04/17/2020, 08/18/2020   PFIZER(Purple Top)SARS-COV-2 Vaccination 01/12/2021   PNEUMOCOCCAL CONJUGATE-20 10/23/2022   Pneumococcal Conjugate-13 11/15/2014   Pneumococcal Polysaccharide-23 09/26/2016   Td 08/15/2007, 04/17/2015   Tdap 04/16/2010   Zoster Recombinant(Shingrix) 04/16/2016, 07/15/2016   Zoster, Live 04/16/2010    TDAP status: Up to date  Flu Vaccine status: Due, Education has been provided regarding the importance of this vaccine. Advised may receive this vaccine at local pharmacy or Health Dept. Aware to provide a copy of the vaccination record if obtained from local pharmacy or Health Dept. Verbalized acceptance and understanding.  Pneumococcal vaccine status: Up to date  Covid-19 vaccine status: Completed vaccines  Qualifies for Shingles Vaccine? Yes   Zostavax completed Yes   Shingrix Completed?: Yes  Screening Tests Health Maintenance  Topic Date Due   Hepatitis C Screening  Never done   COVID-19 Vaccine (6 - 2024-25 season) 12/16/2022   INFLUENZA VACCINE  07/15/2023 (Originally 11/15/2022)   Medicare Annual Wellness (AWV)  03/31/2024   MAMMOGRAM  11/14/2024   DTaP/Tdap/Td (4 - Td or Tdap) 04/16/2025   Colonoscopy  11/22/2032   Pneumonia Vaccine 72+ Years old  Completed   DEXA SCAN  Completed   Zoster Vaccines- Shingrix  Completed   HPV VACCINES  Aged Out    Health Maintenance  Health Maintenance Due  Topic Date Due   Hepatitis C Screening  Never done   COVID-19 Vaccine (6 - 2024-25 season) 12/16/2022    Colorectal cancer screening: Type of screening: Colonoscopy. Completed 11/23/22. Repeat every 10 years  Mammogram status: Completed 11/15/22. Repeat every year  Bone Density status: Completed 11/15/22. Results reflect: Bone density results: OSTEOPOROSIS. Repeat every 3-5 years.  Lung Cancer Screening: (Low Dose CT Chest  recommended if Age 32-80 years, 20 pack-year currently smoking OR have quit w/in 15years.) does not qualify.   Lung Cancer Screening Referral: no  Additional Screening:  Hepatitis C Screening: does not qualify; Completed no  Vision Screening: Recommended annual ophthalmology exams for early detection of glaucoma and other disorders of the eye. Is the patient up to date with their annual eye exam?  Yes  Who is the provider or what is the name of the office in which the patient attends annual eye exams? [facility] If pt is not established with a provider, would they like to be referred to a provider to establish care? No .   Dental Screening: Recommended annual dental exams for proper oral hygiene  Diabetic Foot Exam: n/a  Community Resource Referral / Chronic Care Management: CRR required this visit?  No   CCM required this visit?  No    Plan:     I have personally reviewed and noted the following in the patient's chart:   Medical and social history Use of alcohol, tobacco or illicit drugs  Current medications and supplements including opioid prescriptions. Patient is not currently taking opioid prescriptions. Functional ability and status Nutritional status Physical activity Advanced directives List of other physicians Hospitalizations, surgeries, and ER visits in previous 12 months Vitals Screenings to include cognitive, depression, and falls Referrals and appointments  In addition, I have reviewed and discussed with patient certain preventive  protocols, quality metrics, and best practice recommendations. A written personalized care plan for preventive services as well as general preventive health recommendations were provided to patient.   Sue Lush, LPN   [id number]   After Visit Summary: (MyChart) Due to this being a telephonic visit, the after visit summary with patients personalized plan was offered to patient via MyChart   Nurse Notes: The patient states  they are doing well and has no concerns or questions at this time.

## 2023-04-01 NOTE — Patient Instructions (Signed)
Ms. Slutsky , Thank you for taking time to come for your Medicare Wellness Visit. I appreciate your ongoing commitment to your health goals. Please review the following plan we discussed and let me know if I can assist you in the future.   Referrals/Orders/Follow-Ups/Clinician Recommendations: none  This is a list of the screening recommended for you and due dates:  Health Maintenance  Topic Date Due   Hepatitis C Screening  Never done   COVID-19 Vaccine (6 - 2024-25 season) 12/16/2022   Flu Shot  07/15/2023*   Medicare Annual Wellness Visit  03/31/2024   Mammogram  11/14/2024   DTaP/Tdap/Td vaccine (4 - Td or Tdap) 04/16/2025   Colon Cancer Screening  11/22/2032   Pneumonia Vaccine  Completed   DEXA scan (bone density measurement)  Completed   Zoster (Shingles) Vaccine  Completed   HPV Vaccine  Aged Out  *Topic was postponed. The date shown is not the original due date.    Advanced directives: (Copy Requested) Please bring a copy of your health care power of attorney and living will to the office to be added to your chart at your convenience.  Next Medicare Annual Wellness Visit scheduled for next year: Yes 04/01/2024 @ 1:40 pm telephone

## 2023-07-19 ENCOUNTER — Telehealth: Payer: Self-pay | Admitting: Primary Care

## 2023-07-19 DIAGNOSIS — M810 Age-related osteoporosis without current pathological fracture: Secondary | ICD-10-CM

## 2023-07-19 NOTE — Telephone Encounter (Signed)
 Copied from CRM [id number]. Topic: Appointments - Scheduling Inquiry for Clinic >> Jul 19, 2023  8:55 AM [place] E wrote: Reason for CRM: Patient called in wanting to schedule her next Prolia injection. Patient stated she gets these every 6 months, and her last one was completed 12/21/2022. Patient stated that she will be out of the [place] from end of April until August, so she is questioning if this should be done before she leaves or after she comes back. Callback number for patient is [phone] to schedule.

## 2023-08-08 ENCOUNTER — Telehealth: Payer: Self-pay

## 2023-08-08 MED ORDER — DENOSUMAB 60 MG/ML ~~LOC~~ SOSY
60.0000 mg | PREFILLED_SYRINGE | SUBCUTANEOUS | Status: AC
Start: 2023-11-28 — End: 2024-05-25

## 2023-08-08 NOTE — Telephone Encounter (Signed)
 Bonnie Ramsey

## 2023-08-08 NOTE — Telephone Encounter (Signed)
 Pt ready for scheduling for PROLIA  on or after : 08/08/23  Option# 1: Buy/Bill (Office supplied medication)  Out-of-pocket cost due at time of clinic visit: $0  Number of injection/visits approved: ---  Primary: MEDICARE Prolia  co-insurance: 0% Admin fee co-insurance: 0%  Secondary: TRICARE FOR LIFE-MEDSUP Prolia  co-insurance:  Admin fee co-insurance:   Medical Benefit Details: Date Benefits were checked: 08/08/23 Deductible: $257 Met of $257 Required/ Coinsurance: 0%/ Admin Fee: 0%  Prior Auth: N/A PA# Expiration Date:   # of doses approved: ----------------------------------------------------------------------- Option# 2- Med Obtained from pharmacy:  Pharmacy benefit: Copay $--- (Paid to pharmacy) Admin Fee: --- (Pay at clinic)  Prior Auth: N/A PA# Expiration Date:   # of doses approved:   If patient wants fill through the pharmacy benefit please send prescription to:  --- , and include estimated need by date in rx notes. Pharmacy will ship medication directly to the office.  Patient NOT eligible for Prolia  Copay Card. Copay Card can make patient's cost as little as $25. Link to apply: https://www.amgensupportplus.com/copay  ** This summary of benefits is an estimation of the patient's out-of-pocket cost. Exact cost may very based on individual plan coverage.

## 2023-08-08 NOTE — Telephone Encounter (Signed)
 LM for pt to return call to discuss her prolia  injection.

## 2023-08-08 NOTE — Telephone Encounter (Signed)
 Spoke with pt. She is aware that benefits verification will need to be done prior to her injection. She is leaving for an out of state trip this weekend and will not be back until mid August. Advised her that the benefits verification could take a few weeks to be completed. Pt would like to just wait until she returns from her trip to do her injection. Referral will be placed to complete benefits verification near the time she returns to town. Nothing further was needed at this time.

## 2023-08-08 NOTE — Telephone Encounter (Signed)
 Prolia VOB initiated via [email or web address]  Next Prolia inj DUE: NOW

## 2023-08-08 NOTE — Addendum Note (Signed)
 Addended by: Dewanda Foots C on: 08/08/2023 11:24 AM   Modules accepted: Orders

## 2023-11-18 ENCOUNTER — Other Ambulatory Visit: Payer: Self-pay | Admitting: *Deleted

## 2023-11-18 ENCOUNTER — Telehealth: Payer: Self-pay

## 2023-11-18 DIAGNOSIS — M810 Age-related osteoporosis without current pathological fracture: Secondary | ICD-10-CM

## 2023-11-18 NOTE — Telephone Encounter (Signed)
 Pt has CPE scheduled with Bonnie Ramsey in August.

## 2023-11-18 NOTE — Telephone Encounter (Signed)
 LM for pt to returncall

## 2023-11-18 NOTE — Telephone Encounter (Signed)
 Spoke with pt. She will have the prolia  injection the same day she comes in for her CPE with Mallie. Lab only appt has been scheduled for 11/28/2023.

## 2023-11-18 NOTE — Telephone Encounter (Signed)
 Copied from CRM [id number]. Topic: Appointments - Appointment Scheduling >> Nov 18, 2023  1:50 PM Franky GRADE wrote: Patient/patient representative is calling to schedule an appointment. Refer to attachments for appointment information. Patient is calling to schedule her next prolia  injextion;however, she would like to know if she needs to set up either a lab appointment or ov with Comer Gaskins before scheduling the next injection.  Patient is also due for a mammography screening.

## 2023-11-28 ENCOUNTER — Other Ambulatory Visit (INDEPENDENT_AMBULATORY_CARE_PROVIDER_SITE_OTHER)

## 2023-11-28 ENCOUNTER — Ambulatory Visit: Payer: Self-pay | Admitting: Primary Care

## 2023-11-28 DIAGNOSIS — M810 Age-related osteoporosis without current pathological fracture: Secondary | ICD-10-CM

## 2023-11-28 LAB — BASIC METABOLIC PANEL WITH GFR
BUN: 9 mg/dL (ref 6–23)
CO2: 29 meq/L (ref 19–32)
Calcium: 9.9 mg/dL (ref 8.4–10.5)
Chloride: 101 meq/L (ref 96–112)
Creatinine, Ser: 0.84 mg/dL (ref 0.40–1.20)
GFR: 68.49 mL/min (ref >60.00)
Glucose, Bld: 98 mg/dL (ref 70–99)
Potassium: 4.3 meq/L (ref 3.5–5.1)
Sodium: 137 meq/L (ref 135–145)

## 2023-12-04 ENCOUNTER — Telehealth: Payer: Self-pay | Admitting: Primary Care

## 2023-12-04 DIAGNOSIS — Z1231 Encounter for screening mammogram for malignant neoplasm of breast: Secondary | ICD-10-CM

## 2023-12-04 NOTE — Telephone Encounter (Signed)
 Please call patient:  Let her know that I ordered her mammogram for [facility] in [place]

## 2023-12-04 NOTE — Addendum Note (Signed)
 Addended by: Chelle Cayton K on: 12/04/2023 09:14 PM   Modules accepted: Orders

## 2023-12-04 NOTE — Telephone Encounter (Signed)
 Contacted pt to r/s cpe on tomorrow. Pt requested a mammogram referral? Preferred location is [place], if possible. Cpe r/s to 9/10. Call back # [phone]

## 2023-12-05 ENCOUNTER — Encounter: Admitting: Primary Care

## 2023-12-05 NOTE — Telephone Encounter (Signed)
 Called patient and reviewed all information. Patient verbalized understanding. Will call if any further questions.

## 2023-12-10 ENCOUNTER — Ambulatory Visit
Admission: RE | Admit: 2023-12-10 | Discharge: 2023-12-10 | Disposition: A | Discharge status: Home / Self Care | Source: Ambulatory Visit | Attending: Primary Care | Admitting: Primary Care

## 2023-12-10 DIAGNOSIS — Z1231 Encounter for screening mammogram for malignant neoplasm of breast: Secondary | ICD-10-CM | POA: Diagnosis present

## 2023-12-13 ENCOUNTER — Ambulatory Visit: Payer: Self-pay | Admitting: Primary Care

## 2023-12-25 ENCOUNTER — Encounter: Admitting: Primary Care

## 2023-12-27 ENCOUNTER — Encounter

## 2024-02-10 ENCOUNTER — Telehealth: Payer: Self-pay | Admitting: Primary Care

## 2024-02-10 DIAGNOSIS — M545 Low back pain, unspecified: Secondary | ICD-10-CM

## 2024-02-10 NOTE — Telephone Encounter (Unsigned)
 Copied from CRM [id number]. Topic: Clinical - Medication Refill >> Feb 10, 2024  9:22 AM Olam RAMAN wrote: Medication: gabapentin  (NEURONTIN ) 300 MG capsule  Has the patient contacted their pharmacy? No (Agent: If no, request that the patient contact the pharmacy for the refill. If patient does not wish to contact the pharmacy document the reason why and proceed with request.) (Agent: If yes, when and what did the pharmacy advise?)  This is the patient's preferred pharmacy:  [id number] - [place], [place] - [address] AT [facility] OF [address] [facility] [address] [place] [address] Phone: [phone] Fax: [phone]  Is this the correct pharmacy for this prescription? Yes If no, delete pharmacy and type the correct one.   Has the prescription been filled recently? No  Is the patient out of the medication? No  Has the patient been seen for an appointment in the last year OR does the patient have an upcoming appointment? Yes  Can we respond through MyChart? No  Agent: Please be advised that Rx refills may take up to 3 business days. We ask that you follow-up with your pharmacy.

## 2024-02-11 MED ORDER — GABAPENTIN 300 MG PO CAPS: 600.0000 mg | ORAL_CAPSULE | Freq: Every day | ORAL | 0 refills | Status: DC

## 2024-02-26 ENCOUNTER — Encounter: Payer: Self-pay | Admitting: Primary Care

## 2024-02-26 ENCOUNTER — Telehealth: Payer: Self-pay

## 2024-02-26 ENCOUNTER — Ambulatory Visit (INDEPENDENT_AMBULATORY_CARE_PROVIDER_SITE_OTHER)
Admission: RE | Admit: 2024-02-26 | Discharge: 2024-02-26 | Disposition: A | Discharge status: Home / Self Care | Source: Ambulatory Visit | Attending: Primary Care | Admitting: Primary Care

## 2024-02-26 ENCOUNTER — Ambulatory Visit: Admitting: Primary Care

## 2024-02-26 ENCOUNTER — Ambulatory Visit: Payer: Self-pay | Admitting: Primary Care

## 2024-02-26 VITALS — BP 168/84 | HR 54 | Temp 97.9°F | Ht 63.25 in | Wt 130.5 lb

## 2024-02-26 DIAGNOSIS — G8929 Other chronic pain: Secondary | ICD-10-CM | POA: Diagnosis not present

## 2024-02-26 DIAGNOSIS — S22080A Wedge compression fracture of T11-T12 vertebra, initial encounter for closed fracture: Secondary | ICD-10-CM

## 2024-02-26 DIAGNOSIS — M5441 Lumbago with sciatica, right side: Secondary | ICD-10-CM | POA: Diagnosis not present

## 2024-02-26 DIAGNOSIS — Z23 Encounter for immunization: Secondary | ICD-10-CM | POA: Diagnosis not present

## 2024-02-26 DIAGNOSIS — E785 Hyperlipidemia, unspecified: Secondary | ICD-10-CM | POA: Diagnosis not present

## 2024-02-26 DIAGNOSIS — M810 Age-related osteoporosis without current pathological fracture: Secondary | ICD-10-CM

## 2024-02-26 LAB — COMPREHENSIVE METABOLIC PANEL WITH GFR
ALT: 13 U/L (ref 0–35)
AST: 21 U/L (ref 0–37)
Albumin: 4.6 g/dL (ref 3.5–5.2)
Alkaline Phosphatase: 113 U/L (ref 39–117)
BUN: 10 mg/dL (ref 6–23)
CO2: 30 meq/L (ref 19–32)
Calcium: 10.2 mg/dL (ref 8.4–10.5)
Chloride: 101 meq/L (ref 96–112)
Creatinine, Ser: 0.82 mg/dL (ref 0.40–1.20)
GFR: 70.38 mL/min (ref >60.00)
Glucose, Bld: 90 mg/dL (ref 70–99)
Potassium: 4.7 meq/L (ref 3.5–5.1)
Sodium: 140 meq/L (ref 135–145)
Total Bilirubin: 0.6 mg/dL (ref 0.2–1.2)
Total Protein: 6.8 g/dL (ref 6.0–8.3)

## 2024-02-26 LAB — LIPID PANEL
Cholesterol: 270 mg/dL — ABNORMAL HIGH (ref 0–200)
HDL: 122.7 mg/dL (ref >39.00)
LDL Cholesterol: 134 mg/dL — ABNORMAL HIGH (ref 0–99)
NonHDL: 147.05
Total CHOL/HDL Ratio: 2
Triglycerides: 66 mg/dL (ref 0.0–149.0)
VLDL: 13.2 mg/dL (ref 0.0–40.0)

## 2024-02-26 MED ORDER — PREDNISONE 20 MG PO TABS: ORAL_TABLET | ORAL | 0 refills | Status: DC

## 2024-02-26 MED ORDER — METHOCARBAMOL 500 MG PO TABS
500.0000 mg | ORAL_TABLET | Freq: Three times a day (TID) | ORAL | 0 refills | Status: AC | PRN
Start: 2024-02-26 — End: ?

## 2024-02-26 MED ORDER — DENOSUMAB 60 MG/ML ~~LOC~~ SOSY
60.0000 mg | PREFILLED_SYRINGE | Freq: Once | SUBCUTANEOUS | Status: AC
Start: 2024-03-02 — End: ?

## 2024-02-26 NOTE — Patient Instructions (Signed)
 Start prednisone tablets. Take 3 tablets by mouth daily x 3 days, then 2 tablets x 3 days, then 1 tablet for 3 days.  You may take the methocarbamol  muscle relaxer 3 times daily as needed.  This may cause drowsiness.  Complete xray(s) and labs prior to leaving today. I will notify you of your results once received.  You will either be contacted via phone regarding your referral to neurosurgery, or you may receive a letter on your MyChart portal from our referral team with instructions for scheduling an appointment. Please let us  know if you have not been contacted by anyone within two weeks.  It was a pleasure to see you today!

## 2024-02-26 NOTE — Addendum Note (Signed)
 Addended by: ALBINO SHAVER C on: 02/26/2024 09:15 AM   Modules accepted: Orders

## 2024-02-26 NOTE — Assessment & Plan Note (Signed)
 Lipid panel pending.  Continue fish oil and active lifestyle.

## 2024-02-26 NOTE — Telephone Encounter (Signed)
 Referral has been placed for Prolia  to start benefit verification process.

## 2024-02-26 NOTE — Assessment & Plan Note (Signed)
 Overdue for Prolia  injection.  Will work with her insurance to get it reapproved. Bone density scan up-to-date

## 2024-02-26 NOTE — Telephone Encounter (Signed)
 Pt was here for CPE today and was supposed to get missed Prolia  shot. However, she has missed window for the shot.   Pt still wants to get Prolia  and understands the process will need to be repeated and she will be contacted to schedule. Pt had labs today.

## 2024-02-26 NOTE — Assessment & Plan Note (Signed)
 Appears uncomfortable but no alarm signs on exam.  Start prednisone tablets. Take 3 tablets by mouth daily x 3 days, then 2 tablets x 3 days, then 1 tablet for 3 days. Start methocarbamol  500 mg 3 times daily as needed.  Drowsiness precautions provided. Plain films of the lumbar spine ordered and obtained today.  Referral placed to neurosurgery for further evaluation.

## 2024-02-26 NOTE — Telephone Encounter (Signed)
 Prolia VOB initiated via [email or web address]  Next Prolia inj DUE: NOW

## 2024-02-26 NOTE — Progress Notes (Signed)
 Subjective:    Patient ID: Bonnie Ramsey, female    DOB: 11-Aug-1950, 73 y.o.   MRN: [id number]  Bonnie Ramsey is a very pleasant 73 y.o. female with a history of chronic sinus bradycardia, osteoporosis, chronic back pain, hyperlipidemia who presents today for follow-up of chronic conditions.  Immunizations: -Tetanus: Completed in 2017 -Influenza: Influenza vaccine provided today.  -Shingles: Completed Shingrix series -Pneumonia: Completed Prevnar 20 in 2024 Mammogram: Completed in August 2025 Bone Density Scan: Completed in August 2024  Colonoscopy: Completed in 2024, no further screening needed according to patient.    1) Osteoporosis: Currently managed on Prolia  injections semiannually. She is due for a Prolia  injection today.  Her last bone density scan was in August 2024.  2) Hyperlipidemia: Not managed on statin therapy.  Currently managed on fish oil daily. He denies chest pain, dizziness. She has no concerns today.   3) Chronic Back Pain: Currently managed on gabapentin  600 mg daily. History of lumbar fusion. Her pain has significantly increased as of 2 weeks ago after a long drive to [place] . Her pain is located to the mid lower and right lower back with radiation down to her toes. She's been taking gabapentin  without relief. The only way she can get relief is to lay on the floor. She's tried stretching and walking but this increases here symptoms.   Previously following with orthopedics in [place] years ago who completed her surgery. She is ready for re-evaluation.  She denies loss of bowel or bladder control   BP Readings from Last 3 Encounters:  02/26/24 (!) 168/84  11/23/22 116/82  10/23/22 (!) 112/58     Review of Systems  Respiratory:  Negative for shortness of breath.   Cardiovascular:  Negative for chest pain.  Musculoskeletal:  Positive for back pain.  Neurological:  Negative for numbness.         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   Ulceration of intestine     Social History   Socioeconomic History   Marital status: Single    Spouse name: Not on file   Number of children: Not on file   Years of education: Not on file   Highest education level: Master's degree (e.g., MA, MS, MEng, MEd, MSW, MBA)  Occupational History   Not on file  Tobacco Use   Smoking status: Never   Smokeless tobacco: Never  Vaping Use   Vaping status: Never Used  Substance and Sexual Activity   Alcohol use: Yes    Comment: Occasional glass of Chardonnay or margarita   Drug use: Never   Sexual activity: Not Currently    Birth control/protection: Post-menopausal  Other Topics Concern   Not on file  Social History Narrative   Not on file   Social Drivers of Health   Financial Resource Strain: Low Risk  (02/24/2024)   Overall Financial Resource Strain (CARDIA)    Difficulty of Paying Living Expenses: Not hard at all  Food Insecurity: No Food Insecurity (02/24/2024)   Hunger Vital Sign    Worried About Programme Researcher, Broadcasting/film/video in  the Last Year: Never true    Ran Out of Food in the Last Year: Never true  Transportation Needs: No Transportation Needs (02/24/2024)   PRAPARE - Administrator, Civil Service (Medical): No    Lack of Transportation (Non-Medical): No  Physical Activity: Sufficiently Active (02/24/2024)   Exercise Vital Sign    Days of Exercise per Week: 5 days    Minutes of Exercise per Session: 50 min  Stress: No Stress Concern Present (02/24/2024)   [employer] of Occupational Health - Occupational Stress Questionnaire    Feeling of Stress: Only a little  Social Connections:  Moderately Isolated (02/24/2024)   Social Connection and Isolation Panel    Frequency of Communication with Friends and Family: More than three times a week    Frequency of Social Gatherings with Friends and Family: Three times a week    Attends Religious Services: More than 4 times per year    Active Member of Clubs or Organizations: No    Attends Banker Meetings: Not on file    Marital Status: Divorced  Intimate Partner Violence: Not At Risk (04/01/2023)   Humiliation, Afraid, Rape, and Kick questionnaire    Fear of Current or Ex-Partner: No    Emotionally Abused: No    Physically Abused: No    Sexually Abused: No    Past Surgical History:  Procedure Laterality Date   BACK SURGERY     BIOPSY  11/23/2022   Procedure: BIOPSY;  Surgeon: Unk Corinn Skiff, MD;  Location: [facility] END[facility]COPY;  Service: Gastroenterology;;   BREAST CYST ASPIRATION     CATARACT EXTRACTION Bilateral 06/2022   COLONOSCOPY WITH PROPOFOL  N/A 09/03/2019   Procedure: COLONOSCOPY WITH PROPOFOL ;  Surgeon: Janalyn Keene NOVAK, MD;  Location: [facility] ENDOSCOPY;  Service: Endoscopy;  Laterality: N/A;   COLONOSCOPY WITH PROPOFOL  N/A 11/23/2022   Procedure: COLONOSCOPY WITH PROPOFOL ;  Surgeon: Unk Corinn Skiff, MD;  Location: [facility] ENDOSCOPY;  Service: Gastroenterology;  Laterality: N/A;   EYE SURGERY  03.12.24   Cataract   FRACTURE SURGERY  06.20.22   Left foot   HAND SURGERY Left    JOINT REPLACEMENT     SPINAL FUSION  2015, 2016, 2017   Lumbar   TOTAL KNEE REVISION Left 05/03/2021   Procedure: TOTAL KNEE REVISION;  Surgeon: Melodi Lerner, MD;  Location: [facility];  Service: Orthopedics;  Laterality: Left;    Family History  Problem Relation Age of Onset   Breast cancer Mother 10   Cancer Mother    Arthritis Mother    Hypertension Mother     Allergies  Allergen Reactions   Cipro [Ciprofloxacin-Ciproflox Hcl Er] Hives   Morphine And Codeine Nausea And Vomiting    Current Outpatient  Medications on File Prior to Visit  Medication Sig Dispense Refill   acetaminophen  (TYLENOL ) 325 MG tablet Take 650 mg by mouth every 6 (six) hours as needed for moderate pain.     Calcium Carb-Cholecalciferol 600-400 MG-UNIT TABS Take 1 tablet by mouth daily.     denosumab  (PROLIA ) 60 MG/ML SOSY injection Inject 60 mg into the skin every 6 (six) months. Ordered through providers office. Do not send to pharmacy-ah     gabapentin  (NEURONTIN ) 300 MG capsule Take 2 capsules (600 mg total) by mouth at bedtime. For back pain 60 capsule 0   Omega 3-6-9 Fatty Acids (OMEGA 3-6-9 COMPLEX PO) Take 1 capsule by mouth daily.     Current Facility-Administered Medications on File Prior to Visit  Medication Dose Route Frequency Provider Last Rate Last Admin   denosumab  (PROLIA ) injection 60 mg  60 mg Subcutaneous Q6 months Tamakia Porto K, NP        BP (!) 168/84   Pulse (!) 54   Temp 97.9 F (36.6 C) (Oral)   Ht 5' 3.25 (1.607 m)   Wt 130 lb 8 oz (59.2 kg)   SpO2 99%   BMI 22.93 kg/m  Objective:   Physical Exam Constitutional:      Comments: Appears uncomfortable today, unable to sit during visits.   Cardiovascular:     Rate and Rhythm: Normal rate and regular rhythm.  Pulmonary:     Effort: Pulmonary effort is normal.     Breath sounds: Normal breath sounds.  Abdominal:     General: Bowel sounds are normal.     Palpations: Abdomen is soft.     Tenderness: There is no abdominal tenderness.  Musculoskeletal:     Cervical back: Neck supple.  Skin:    General: Skin is warm and dry.  Neurological:     Mental Status: She is alert and oriented to person, place, and time.  Psychiatric:        Mood and Affect: Mood normal.     Physical Exam        Assessment & Plan:  Osteoporosis, unspecified osteoporosis type, unspecified pathological fracture presence Assessment & Plan: Overdue for Prolia  injection.  Will work with her insurance to get it reapproved. Bone density scan  up-to-date   Hyperlipidemia, unspecified hyperlipidemia type Assessment & Plan: Lipid panel pending.  Continue fish oil and active lifestyle.  Orders: -     Lipid panel -     Comprehensive metabolic panel with GFR  Chronic bilateral low back pain with right-sided sciatica Assessment & Plan: Acute on chronic flare today No alarm signs upon exam.  Will treat acute flare.  Will also refer to neurosurgery for further evaluation.  Lumbar plain films obtained today  Orders: -     DG Lumbar Spine Complete -     Ambulatory referral to Neurosurgery  Acute on chronic low back pain Assessment & Plan: Appears uncomfortable but no alarm signs on exam.  Start prednisone tablets. Take 3 tablets by mouth daily x 3 days, then 2 tablets x 3 days, then 1 tablet for 3 days. Start methocarbamol  500 mg 3 times daily as needed.  Drowsiness precautions provided. Plain films of the lumbar spine ordered and obtained today.  Referral placed to neurosurgery for further evaluation.  Orders: -     predniSONE; Take 3 tablets by mouth daily x 3 days, then 2 tablets x 3 days, then 1 tablet for 3 days.  Dispense: 18 tablet; Refill: 0 -     Methocarbamol ; Take 1 tablet (500 mg total) by mouth 3 (three) times daily as needed for muscle spasms.  Dispense: 30 tablet; Refill: 0 -     DG Lumbar Spine Complete  Need for influenza vaccination -     Flu vaccine HIGH DOSE PF(Fluzone Trivalent)    Assessment and Plan Assessment & Plan         Comer MARLA Gaskins, NP    History of Present Illness

## 2024-02-26 NOTE — Assessment & Plan Note (Signed)
 Acute on chronic flare today No alarm signs upon exam.  Will treat acute flare.  Will also refer to neurosurgery for further evaluation.  Lumbar plain films obtained today

## 2024-02-27 NOTE — Telephone Encounter (Signed)
 SABRA

## 2024-02-27 NOTE — Telephone Encounter (Signed)
 Pt ready for scheduling for PROLIA  on or after : 02/27/24  Option# 1: Buy/Bill (Office supplied medication)  Out-of-pocket cost due at time of clinic visit: $0  Number of injection/visits approved: ---  Primary: MEDICARE Prolia  co-insurance: 0% Admin fee co-insurance: 0%  Secondary: TRICARE FOR LIFE Prolia  co-insurance:  Admin fee co-insurance:   Medical Benefit Details: Date Benefits were checked: 02/26/24 Deductible: $257 Met of $257 Required/ Coinsurance: 0%/ Admin Fee: 0%  Prior Auth: N/A PA# Expiration Date:   # of doses approved: ----------------------------------------------------------------------- Option# 2- Med Obtained from pharmacy:  Pharmacy benefit: Copay $--- (Paid to pharmacy) Admin Fee: --- (Pay at clinic)  Prior Auth: --- PA# Expiration Date:   # of doses approved:   If patient wants fill through the pharmacy benefit please send prescription to: ---, and include estimated need by date in rx notes. Pharmacy will ship medication directly to the office.  Patient NOT eligible for Prolia  Copay Card. Copay Card can make patient's cost as little as $25. Link to apply: https://www.amgensupportplus.com/copay  ** This summary of benefits is an estimation of the patient's out-of-pocket cost. Exact cost may very based on individual plan coverage.

## 2024-03-03 ENCOUNTER — Ambulatory Visit

## 2024-03-04 ENCOUNTER — Ambulatory Visit

## 2024-03-05 ENCOUNTER — Ambulatory Visit

## 2024-03-05 DIAGNOSIS — M810 Age-related osteoporosis without current pathological fracture: Secondary | ICD-10-CM

## 2024-03-05 MED ORDER — DENOSUMAB 60 MG/ML ~~LOC~~ SOSY
60.0000 mg | PREFILLED_SYRINGE | Freq: Once | SUBCUTANEOUS | Status: AC
Start: 2024-03-05 — End: 2024-03-05
  Administered 2024-03-05: 60 mg via SUBCUTANEOUS

## 2024-03-05 MED ORDER — DENOSUMAB 60 MG/ML ~~LOC~~ SOSY
60.0000 mg | PREFILLED_SYRINGE | Freq: Once | SUBCUTANEOUS | Status: AC
Start: 2024-09-02 — End: ?

## 2024-03-05 NOTE — Progress Notes (Signed)
 Per orders of Mallie Gaskins, DPN AGNP-C,who is out of office and CHRISTELLA Crandall NP who is in office injection of Prolia  60 mg [place] given by Laray Arenas in left arm. Patient tolerated injection well. Patient will make appointment for 6 month.

## 2024-03-10 ENCOUNTER — Ambulatory Visit
Admission: RE | Admit: 2024-03-10 | Discharge: 2024-03-10 | Disposition: A | Discharge status: Home / Self Care | Source: Ambulatory Visit | Attending: Primary Care | Admitting: Primary Care

## 2024-03-10 ENCOUNTER — Ambulatory Visit
Admission: RE | Admit: 2024-03-10 | Discharge: 2024-03-10 | Disposition: A | Source: Ambulatory Visit | Attending: Primary Care

## 2024-03-10 DIAGNOSIS — M5441 Lumbago with sciatica, right side: Secondary | ICD-10-CM | POA: Diagnosis present

## 2024-03-10 DIAGNOSIS — S22080A Wedge compression fracture of T11-T12 vertebra, initial encounter for closed fracture: Secondary | ICD-10-CM

## 2024-03-10 DIAGNOSIS — G8929 Other chronic pain: Secondary | ICD-10-CM

## 2024-03-17 ENCOUNTER — Ambulatory Visit: Payer: Self-pay | Admitting: Primary Care

## 2024-03-17 ENCOUNTER — Encounter: Admitting: Primary Care

## 2024-03-18 ENCOUNTER — Telehealth: Payer: Self-pay | Admitting: Primary Care

## 2024-03-18 DIAGNOSIS — G8929 Other chronic pain: Secondary | ICD-10-CM

## 2024-03-18 NOTE — Telephone Encounter (Unsigned)
 Copied from CRM #[id number]. Topic: Clinical - Medication Refill >> Mar 18, 2024 11:36 AM Roselie BROCKS wrote: Medication: gabapentin  (NEURONTIN ) 300 MG capsule  Has the patient contacted their pharmacy? Yes (Agent: If no, request that the patient contact the pharmacy for the refill. If patient does not wish to contact the pharmacy document the reason why and proceed with request.) (Agent: If yes, when and what did the pharmacy advise?)  This is the patient's preferred pharmacy:  [id number] - [place], [place] - [address] AT [facility] [address] [facility] [address] [place] [address] Phone: [phone] Fax: [phone]  Is this the correct pharmacy for this prescription? Yes If no, delete pharmacy and type the correct one.   Has the prescription been filled recently? Yes  Is the patient out of the medication? Yes  Has the patient been seen for an appointment in the last year OR does the patient have an upcoming appointment? Yes  Can we respond through MyChart? Yes  Agent: Please be advised that Rx refills may take up to 3 business days. We ask that you follow-up with your pharmacy.

## 2024-03-19 ENCOUNTER — Telehealth: Payer: Self-pay | Admitting: Physician Assistant

## 2024-03-19 MED ORDER — GABAPENTIN 300 MG PO CAPS: 600.0000 mg | ORAL_CAPSULE | Freq: Every day | ORAL | 2 refills | Status: DC

## 2024-03-19 NOTE — Telephone Encounter (Signed)
 Pt has brought in Older MRI reports, surgery reports & injection report as well. They have also had a recent mri @ [facility] that has resulted. Will be scanned into the chart.

## 2024-03-19 NOTE — Telephone Encounter (Signed)
 noted

## 2024-03-19 NOTE — Progress Notes (Signed)
 Referring Physician:  Gretta Comer POUR, NP [address] [place],  [place] [address]  Primary Physician:  Gretta Comer POUR, NP  History of Present Illness: 03/24/2024 Bonnie Ramsey has a previous history of L1-2 decompression, prior fusion extending from L3-S1, previous piriformis release.  She comes in today for chronic neuropathic pain and increased back and right lower extremity pain over the past several months.  This was exacerbated when she went on a long car ride to [place] .  She states she has some pain that goes from her hip and radiates to her groin and she also has pain that extends from her buttock area down her leg and goes into the top of her foot.  She describes as an intense burning pain that is not relieved by prolonged standing gabapentin  dosage.  It is affecting her sleep at night.  She did do a short dose of steroids which helped some.  Her pain is worse with walking, sleeping, standing.  She is unable to get comfortable in bed at night.  No new incontinence or saddle anesthesia.  No changes to her gait.     Bowel/Bladder Dysfunction: none  Conservative measures:  Physical therapy:  Has not participated in Multimodal medical therapy including regular antiinflammatories:  prednisone , gabapentin , methocarbamol   Injections: yes epidural steroid injections  Past Surgery:  2015, 2016 (spacers put in), 2017 (fusion re done) Lumber Fusion in [place]   Bonnie Ramsey has no symptoms of cervical myelopathy.  The symptoms are causing a significant impact on the patient's life.   Review of Systems:  A 10 point review of systems is negative, except for the pertinent positives and negatives detailed in the HPI.  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   Ulceration of intestine     Past Surgical History: Past Surgical History:  Procedure Laterality Date   BACK SURGERY     BIOPSY  11/23/2022   Procedure: BIOPSY;  Surgeon: Unk Corinn Skiff, MD;  Location: [facility] ENDOSCOPY;  Service: Gastroenterology;;   BREAST CYST ASPIRATION     CATARACT EXTRACTION Bilateral 06/2022   COLONOSCOPY WITH PROPOFOL  N/A 09/03/2019   Procedure: COLONOSCOPY WITH PROPOFOL ;  Surgeon: Janalyn Keene NOVAK, MD;  Location: [facility] ENDOSCOPY;  Service: Endoscopy;  Laterality: N/A;   COLONOSCOPY WITH PROPOFOL  N/A 11/23/2022   Procedure: COLONOSCOPY WITH PROPOFOL ;  Surgeon: Unk Corinn Skiff, MD;  Location: [facility] ENDOSCOPY;  Service: Gastroenterology;  Laterality: N/A;   EYE SURGERY  03.12.24   Cataract   FRACTURE SURGERY  06.20.22   Left foot   HAND SURGERY Left    JOINT REPLACEMENT     SPINAL FUSION  2015, 2016, 2017   Lumbar   TOTAL KNEE REVISION Left 05/03/2021   Procedure: TOTAL KNEE REVISION;  Surgeon: Melodi Lerner, MD;  Location: [facility] ORS;  Service: Orthopedics;  Laterality: Left;    Allergies: Allergies as of 03/24/2024 - Review Complete 02/26/2024  Allergen Reaction Noted   Cipro [ciprofloxacin-ciproflox hcl er] Hives 09/03/2019   Morphine and codeine Nausea And Vomiting 09/03/2019    Medications: Outpatient Encounter  Medications as of 03/24/2024  Medication Sig   acetaminophen  (TYLENOL ) 325 MG tablet Take 650 mg by mouth every 6 (six) hours as needed for moderate pain.   Calcium Carb-Cholecalciferol 600-400 MG-UNIT TABS Take 1 tablet by mouth daily.   denosumab  (PROLIA ) 60 MG/ML SOSY injection Inject 60 mg into the skin every 6 (six) months. Ordered through providers office. Do not send to pharmacy-ah   gabapentin  (NEURONTIN ) 300 MG capsule Take 2 capsules (600 mg total) by mouth at bedtime. For back pain    methocarbamol  (ROBAXIN ) 500 MG tablet Take 1 tablet (500 mg total) by mouth 3 (three) times daily as needed for muscle spasms.   Omega 3-6-9 Fatty Acids (OMEGA 3-6-9 COMPLEX PO) Take 1 capsule by mouth daily.   [DISCONTINUED] predniSONE  (DELTASONE ) 20 MG tablet Take 3 tablets by mouth daily x 3 days, then 2 tablets x 3 days, then 1 tablet for 3 days.   [DISCONTINUED] gabapentin  (NEURONTIN ) 300 MG capsule Take 2 capsules (600 mg total) by mouth at bedtime. For back pain   Facility-Administered Encounter Medications as of 03/24/2024  Medication   denosumab  (PROLIA ) injection 60 mg   denosumab  (PROLIA ) injection 60 mg   [START ON 09/02/2024] denosumab  (PROLIA ) injection 60 mg    Social History: Social History   Tobacco Use   Smoking status: Never   Smokeless tobacco: Never  Vaping Use   Vaping status: Never Used  Substance Use Topics   Alcohol use: Yes    Comment: Occasional glass of Chardonnay or margarita   Drug use: Never    Family Medical History: Family History  Problem Relation Age of Onset   Breast cancer Mother 105   Cancer Mother    Arthritis Mother    Hypertension Mother     Physical Examination: @VITALWITHPAIN @  General: Patient is well developed, well nourished, calm, collected, and in no apparent distress. Attention to examination is appropriate.  Psychiatric: Patient is non-anxious.  Head:  Pupils equal, round, and reactive to light.  ENT:  Oral mucosa appears well hydrated.  Neck:   Supple.  Full range of motion.  Respiratory: Patient is breathing without any difficulty.  Extremities: No edema.  Vascular: Palpable dorsal pedal pulses.  Skin:   On exposed skin, there are no abnormal skin lesions.  NEUROLOGICAL:     Awake, alert, oriented to person, place, and time.  Speech is clear and fluent. Fund of knowledge is appropriate.   Cranial Nerves: Pupils equal round and reactive to light.  Facial tone is symmetric.   ROM of spine: Some tenderness  palpation of her lumbar paraspinals  Patient with pain to internal and external rotation of her right hip.  Strength:  Side Iliopsoas Quads Hamstring PF DF EHL  R 5 5 5 5 5 5   L 5 5 5 5 5 5    Reflexes are absent on her right patella, 1+ on the left.  Absent in bilateral Achilles.  Clonus is not present.  Negative straight leg raise.  Toes are down-going.  Bilateral upper and lower extremity sensation is intact to light touch, but some decreased sensation in right lower extremity. Gait is normal.   No difficulty with tandem gait.   No evidence of dysmetria noted.  Medical Decision Making  Imaging: EXAM: MRI LUMBAR SPINE 03/10/2024 09:29:05 AM   TECHNIQUE: Multiplanar multisequence MRI of the lumbar spine was performed without the administration of intravenous contrast.   COMPARISON: Thoracic MRI reported separately today. Lumbar radiographs 02/26/2024.   CLINICAL HISTORY:  73 year old female. History of lumbar fusion 2017, low back pain radiating into right buttock and hip with burning in leg and foot.   FINDINGS: Normal lumbar segmentation, concordant with the thoracic numbering today.   BONES AND ALIGNMENT: Stable lumbar lordosis from the recent radiographs. Subtle retrolisthesis of L2 on L3. Mildly exaggerated kyphosis at the thoracolumbar junction. Previous lumbar decompression and fusion. Hardware susceptibility artifact from the L3 to the S1 vertebral levels. Background bone marrow signal within normal limits. Chronic degenerative upper lumbar vertebral body and endplate marrow signal changes. Faint degenerative appearing marrow edema at L1-L2 on series 6 image 9. Intact visible sacrum and SI joints. No other marrow edema.   SPINAL CORD: The conus medullaris terminates normally at L1-L2.   SOFT TISSUES: Postoperative changes to the posterior lumbar paraspinal soft tissues with no adverse features.   DEGENERATIVE:   Lower thoracic spinal stenosis is reported  separately.   L1-L2: Advanced disc space loss. Bulky but mostly anterior disc osteophyte complex. Previous laminectomy but moderate residual posterior element hypertrophy at this level (series 10 image 7). Mild spinal stenosis at the level of the conus medullaris. Mild right L1 neural foraminal stenosis.   L2-L3: Previous decompression. Mild residual circumferential disc osteophyte complex. Asymmetric right foraminal involvement, but no significant stenosis.   L3-L4: Prior decompression and fusion. Capacious spinal canal and no adverse features identified.   L4-L5: Prior decompression and fusion. Capacious spinal canal, no adverse features.   L5-S1: Prior decompression and fusion. Capacious spinal canal. Asymmetric foraminal disc osteophyte complex greater on the left. Mild left L5 neural foraminal stenosis (series 5 image 12).   IMPRESSION: 1. Previous lumbar decompression beginning at L1-L2, and prior posterior fusion from L3 to S1. 2. Mild multifactorial spinal stenosis at L1-L2, level of the conus medullaris. No conus mass effect or signal abnormality. 3. No other lumbar spinal stenosis. 4. Lower thoracic spine degeneration and spinal stenosis, detailed separately today.  MR Thoracic Spine without 03/10/2024 09:28:39 AM   TECHNIQUE: Multiplanar multisequence MRI of the thoracic spine was performed without the administration of intravenous contrast.   COMPARISON: Lumbar MRI reported separately today.   CLINICAL HISTORY: 73 year old female with thoracic compression fracture and low back pain.   FINDINGS:   CERVICAL SPINE: Limited sagittal imaging of the cervical spine reveals degenerative spondylolisthesis at C4-C5, some straightening and reversal of the normal cervical lordosis, and multilevel chronic cervical disc and endplate degeneration. Multifactorial cervical spinal stenosis is suspected at C4-C5.   BONES AND ALIGNMENT: Thoracic segmentation appears to  be normal. Mild to moderate thoracic scoliosis appears to be levoconvex in the upper and dextroconvex in the lower thoracic spine. Superimposed degenerative mild grade 1 anterolisthesis at multiple thoracic levels including from T2-T3 to T4-T5, again at T9-T10, and T11-T12. Maintained thoracic vertebral height. Normal background bone marrow signal with confluent degenerative vertebral body marrow signal changes in the mid thoracic spine especially T5 and T6 (series 18 image 9). But no convincing thoracic vertebral marrow edema.   SPINAL CORD: Maintained thoracic spinal cord volume. The conus medullaris occurs below T12-L1 and is not well evaluated on these images.   SOFT TISSUES: Unremarkable.   VISCERA: Negative visible chest and upper abdominal viscera.   DEGENERATIVE:   T1-T2: Moderate facet and ligamentum flavum hypertrophy. No spinal stenosis. Moderate to severe bilateral T1 neural foraminal stenosis.   T2-T3: Grade 1 anterolisthesis. Small central disc protrusion or pseudodisc on series 20 image 8. Severe facet hypertrophy on the right, mild to moderate on the  left. Mild spinal stenosis. No spinal cord mass effect. Moderate to severe right T2 neural foraminal stenosis.   T3-T4: Mild anterolisthesis. Minor disc bulging. Moderate facet and ligamentum flavum hypertrophy. No spinal stenosis. Mild to moderate right T3 neural foraminal stenosis.   T4-T5: Grade 1 anterolisthesis. Circumferential disc bulge. Moderate posterior ligamentum flavum hypertrophy greater on the right. No spinal stenosis. Moderate to severe right T4 neural foraminal stenosis.   T5-T6: Severe disc space loss. Circumferential disc osteophyte complex asymmetric to the left. Mild to moderate facet and ligamentum flavum hypertrophy greater on the left. No significant spinal stenosis. Moderate left T5 neural foraminal stenosis.   T6-T7: Severe disc space loss. Circumferential disc osteophyte complex.  Mild to moderate posterior element hypertrophy. Mild T6 neural foraminal stenosis.   T7-T8: Disc space loss with circumferential disc bulge. Moderate facet and ligamentum flavum hypertrophy. No spinal stenosis. Moderate right T7 foraminal stenosis.   T8-T9: Circumferential disc bulge with a broad based posterior component. Moderate facet and ligamentum flavum hypertrophy. No spinal stenosis. Moderate left and moderate to severe right T8 neural foraminal stenosis.   T9-T10: Circumferential disc bulge and endplate spurring asymmetric to the right. Severe facet and ligamentum flavum hypertrophy. Degenerative facet joint fluid on the right (series 20 image 29). Mild spinal stenosis. No spinal cord mass effect. Moderate left and severe right T9 foraminal stenosis.   T10-T11: Circumferential disc bulge. Moderate to severe facet and ligamentum flavum hypertrophy. Mild spinal stenosis. No spinal cord mass effect. Moderate to severe bilateral T10 neural foraminal stenosis.   T11-T12: Grade 1 anterolisthesis. Disc space loss with bulky circumferential disc osteophyte complex. Moderate to severe facet and ligamentum flavum hypertrophy. Moderate spinal stenosis and mild spinal cord mass effect (series 17 image 10 and series 20 image 35). No spinal cord signal abnormality. Moderate to severe bilateral T11 foraminal stenosis.   T12-L1: Disc space loss with circumferential disc osteophyte complex. Moderate posterior ligamentum flavum hypertrophy. Mild spinal stenosis and spinal cord mass effect (series 20 image 37). No cord signal abnormality. No foraminal stenosis.   IMPRESSION: 1. Advanced thoracic spine degeneration with underlying thoracic scoliosis and multilevel grade 1 anterolisthesis. Widespread bulky thoracic posterior element degeneration. No acute osseous abnormality. 2. Moderate multifactorial thoracic spinal stenosis at T11-T12 with mild spinal cord mass effect, no cord signal  abnormality. Mild spinal stenosis at T2-T3, T9-T10, T10-T11, and T12-L1. 3. Widespread moderate and severe degenerative neural foraminal stenosis, worst at the bilateral T1, right T2, right T4, right T8 , right T9, bilateral T10 and T11 nerve levels. 4. Partially visible cervical spine degeneration with spondylolisthesis and suspected cervical spinal stenosis   I have personally reviewed the images and agree with the above interpretation.  Assessment and Plan: Bonnie Ramsey is a pleasant 73 y.o. female has a previous history of L1-2 decompression, prior fusion extending from L3-S1, previous piriformis release.  She comes in today for chronic neuropathic pain and increased back and right lower extremity pain over the past several months.  This was exacerbated when she went on a long car ride to [place] .   On MRI, patient does have significant arthritic changes and stenosis in her thoracic spine however does not have any symptoms of thoracic myelopathy or radiculopathy.  Her lumbar spine showed no significant stenosis.  I do think pain is likely acute on chronic neuropathic pain in the L4-5 distribution.  She also has signs of primary hip pain given pain with internal and external rotation.  Plan includes the following:  - New  physical therapy referral placed for both her back and hip - Would like her to meet with pain team regarding possible injection and discussion regarding spinal cord stimulator - Plan to increase gabapentin  to 300 mg 1-2 times a day and 900 at night.  Patient had already been taking 900 at night only.  We did discuss the risk and benefits of this medication and the potential for increased sedation, dizziness, increased fall risk. - Plan for x-rays of patient's right hip today.  Will also plan for orthopedic referral regarding this.  Thank you for involving me in the care of this patient.    Lyle Decamp, PA-C Dept. of Neurosurgery

## 2024-03-24 ENCOUNTER — Other Ambulatory Visit: Payer: Self-pay | Admitting: Physician Assistant

## 2024-03-24 ENCOUNTER — Ambulatory Visit: Admitting: Physician Assistant

## 2024-03-24 ENCOUNTER — Encounter: Payer: Self-pay | Admitting: Physician Assistant

## 2024-03-24 ENCOUNTER — Ambulatory Visit

## 2024-03-24 VITALS — BP 128/76 | Ht 63.25 in | Wt 131.1 lb

## 2024-03-24 DIAGNOSIS — M25551 Pain in right hip: Secondary | ICD-10-CM

## 2024-03-24 DIAGNOSIS — M961 Postlaminectomy syndrome, not elsewhere classified: Secondary | ICD-10-CM

## 2024-03-24 DIAGNOSIS — G8929 Other chronic pain: Secondary | ICD-10-CM

## 2024-03-24 DIAGNOSIS — M545 Low back pain, unspecified: Secondary | ICD-10-CM

## 2024-03-24 MED ORDER — METHYLPREDNISOLONE 4 MG PO TBPK: ORAL_TABLET | ORAL | 0 refills | Status: AC

## 2024-03-24 MED ORDER — GABAPENTIN 300 MG PO CAPS: ORAL_CAPSULE | ORAL | 2 refills | Status: AC

## 2024-03-30 ENCOUNTER — Ambulatory Visit: Payer: Self-pay | Admitting: Physician Assistant

## 2024-03-31 ENCOUNTER — Ambulatory Visit: Admitting: Podiatry

## 2024-03-31 DIAGNOSIS — M216X2 Other acquired deformities of left foot: Secondary | ICD-10-CM | POA: Diagnosis not present

## 2024-03-31 DIAGNOSIS — M7752 Other enthesopathy of left foot: Secondary | ICD-10-CM | POA: Diagnosis not present

## 2024-03-31 NOTE — Progress Notes (Unsigned)
 Subjective:  Patient ID: Bonnie Ramsey, female    DOB: June 16, 1950,  MRN: [id number]  Chief Complaint  Patient presents with   Callouses    73 y.o. female presents with the above complaint.  Patient presents with complaint of left first metatarsophalangeal joint pain.  She states it is red swollen.  She wanted to get it evaluated feeling a little bit better.  She wants to discuss steroid injection.  She does have some plantar fat pad atrophy.  She does walk on her feet.  Hurts with ambulation pain scale 7 out of 10 dull aching nature   Review of Systems: Negative except as noted in the HPI. Denies N/V/F/Ch.  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 There is no height or weight on file to calculate BMI. Constitutional Well developed. Well nourished.  Vascular Dorsalis pedis pulses palpable bilaterally. Posterior tibial pulses palpable bilaterally. Capillary refill normal to all digits.  No cyanosis or clubbing noted. Pedal hair growth normal.  Neurologic Normal speech. Oriented to person, place, and time. Epicritic sensation to light touch grossly present bilaterally.  Dermatologic Nails well groomed and normal in appearance. No open wounds. No skin lesions.  Orthopedic: Pain on palpation to the left first metatarsophalangeal joint pain with range of motion of the joint no deep intra-articular pain noted no crepitus noted.  No red noted to the joint.  Mild swelling noted.  Does not feel warm.   Plantar fat pad atrophy noted   Radiographs: None Assessment:   1. Capsulitis of metatarsophalangeal (MTP) joint of left foot   2. 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  Left first MTP capsulitis with underlying plantar fat pad atrophy - All questions and concerns were discussed with the patient in extensive detail given the amount of pain that she is having should benefit from steroid injection of decreasing inflammatory complaints as her pain.  Patient agrees with plan will like to proceed with steroid injection - Shoe gear modification discussed -A steroid injection was performed at left first MTP using 1% plain Lidocaine  and 10 mg of Kenalog. This was well tolerated.   No follow-ups on file.     [1]  Current Outpatient Medications:    methylPREDNISolone  (MEDROL  DOSEPAK) 4 MG TBPK tablet, Take by mouth daily, taper daily dose per package instructions., Disp: 21 tablet, Rfl: 0   acetaminophen  (TYLENOL ) 325 MG tablet, Take 650 mg by mouth every 6 (six) hours as needed for moderate pain., Disp: , Rfl:    Calcium Carb-Cholecalciferol 600-400 MG-UNIT TABS, Take 1 tablet by mouth daily., Disp: , Rfl:    denosumab  (PROLIA ) 60 MG/ML SOSY injection, Inject 60 mg into the skin every 6 (six) months. Ordered through providers office. Do not send to pharmacy-ah, Disp: , Rfl:    gabapentin  (NEURONTIN ) 300 MG capsule, Take 1 capsule (300 mg total) by mouth 2 (  two) times daily AND 3 capsules (900 mg total) at bedtime. For neuropathic pain., Disp: 180 capsule, Rfl: 2   methocarbamol  (ROBAXIN ) 500 MG tablet, Take 1 tablet (500 mg total) by mouth 3 (three) times daily as needed for muscle spasms., Disp: 30 tablet, Rfl: 0   Omega 3-6-9 Fatty Acids (OMEGA 3-6-9 COMPLEX PO), Take 1 capsule by mouth daily., Disp: , Rfl:   Current Facility-Administered Medications:    denosumab  (PROLIA ) injection 60 mg, 60 mg, Subcutaneous, Q6 months,  Clark, Katherine K, NP   denosumab  (PROLIA ) injection 60 mg, 60 mg, Subcutaneous, Once, Gretta Comer POUR, NP   NOREEN ON 09/02/2024] denosumab  (PROLIA ) injection 60 mg, 60 mg, Subcutaneous, Once, Wendee Lynwood HERO, NP [2]  Social History Tobacco Use  Smoking Status Never  Smokeless Tobacco Never  [3]  Allergies Allergen Reactions   Cipro [Ciprofloxacin-Ciproflox Hcl Er] Hives   Morphine And Codeine Nausea And Vomiting

## 2024-04-01 ENCOUNTER — Ambulatory Visit: Payer: Medicare Other

## 2024-04-01 VITALS — Ht 63.25 in | Wt 131.0 lb

## 2024-04-01 DIAGNOSIS — Z0000 Encounter for general adult medical examination without abnormal findings: Secondary | ICD-10-CM

## 2024-04-01 NOTE — Patient Instructions (Signed)
 Ms. Hargraves,  Thank you for taking the time for your Medicare Wellness Visit. I appreciate your continued commitment to your health goals. Please review the care plan we discussed, and feel free to reach out if I can assist you further.  Please note that Annual Wellness Visits do not include a physical exam. Some assessments may be limited, especially if the visit was conducted virtually. If needed, we may recommend an in-person follow-up with your provider.  Ongoing Care Seeing your primary care provider every 3 to 6 months helps us  monitor your health and provide consistent, personalized care.   Referrals If a referral was made during today's visit and you haven't received any updates within two weeks, please contact the referred provider directly to check on the status.  Recommended Screenings:  Health Maintenance  Topic Date Due   Hepatitis C Screening  Never done   COVID-19 Vaccine (6 - 2025-26 season) 12/16/2023   Medicare Annual Wellness Visit  03/31/2024   DTaP/Tdap/Td vaccine (4 - Td or Tdap) 04/16/2025   Breast Cancer Screening  12/09/2025   Colon Cancer Screening  11/22/2032   Pneumococcal Vaccine for age over 32  Completed   Flu Shot  Completed   Osteoporosis screening with Bone Density Scan  Completed   Zoster (Shingles) Vaccine  Completed   Meningitis B Vaccine  Aged Out       03/29/2024    9:48 AM  Advanced Directives  Does Patient Have a Medical Advance Directive? Yes  Type of Estate Agent of [place];Living will  Does patient want to make changes to medical advance directive? No - Patient declined  Copy of Healthcare Power of Attorney in Chart? No - copy requested    Vision: Annual vision screenings are recommended for early detection of glaucoma, cataracts, and diabetic retinopathy. These exams can also reveal signs of chronic conditions such as diabetes and high blood pressure.  Dental: Annual dental screenings help detect early signs  of oral cancer, gum disease, and other conditions linked to overall health, including heart disease and diabetes.

## 2024-04-01 NOTE — Progress Notes (Signed)
 Chief Complaint  Patient presents with   Medicare Wellness     Subjective:   Bonnie Ramsey is a 73 y.o. female who presents for a Medicare Annual Wellness Visit.  Visit info / Clinical Intake: Medicare Wellness Visit Type:: Subsequent Annual Wellness Visit Persons participating in visit and providing information:: patient Medicare Wellness Visit Mode:: Telephone If telephone:: video declined Since this visit was completed virtually, some vitals may be partially provided or unavailable. Missing vitals are due to the limitations of the virtual format.: Unable to obtain vitals - no equipment If Telephone or Video please confirm:: I connected with patient using audio/video enable telemedicine. I verified patient identity with two identifiers, discussed telehealth limitations, and patient agreed to proceed. Patient Location:: home Provider Location:: office Interpreter Needed?: No Pre-visit prep was completed: yes AWV questionnaire completed by patient prior to visit?: yes Date:: 03/29/24 Living arrangements:: (!) (Patient-Rptd) lives alone Patient's Overall Health Status Rating: (Patient-Rptd) very good Typical amount of pain: (Patient-Rptd) some Does pain affect daily life?: (!) (Patient-Rptd) yes Are you currently prescribed opioids?: no  Dietary Habits and Nutritional Risks How many meals a day?: (Patient-Rptd) 2 Eats fruit and vegetables daily?: (Patient-Rptd) yes Most meals are obtained by: (Patient-Rptd) preparing own meals In the last 2 weeks, have you had any of the following?: none Diabetic:: no  Functional Status Activities of Daily Living (to include ambulation/medication): (Patient-Rptd) Independent Ambulation: (Patient-Rptd) Independent Medication Administration: (Patient-Rptd) Independent Home Management (perform basic housework or laundry): (Patient-Rptd) Independent Manage your own finances?: (Patient-Rptd) yes Primary transportation is: (Patient-Rptd)  driving Concerns about vision?: no *vision screening is required for WTM* Concerns about hearing?: (!) yes Uses hearing aids?: no (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) Hear whispered voice?: (!) no *in-person visit only*  Fall Screening Falls in the past year?: (Patient-Rptd) 0 Number of falls in past year: 0 Was there an injury with Fall?: 0 Fall Risk Category Calculator: 0 Patient Fall Risk Level: Low Fall Risk  Fall Risk Patient at Risk for Falls Due to: No Fall Risks Fall risk Follow up: Falls evaluation completed; Education provided; Falls prevention discussed  Home and Transportation Safety: All rugs have non-skid backing?: (Patient-Rptd) yes All stairs or steps have railings?: (Patient-Rptd) yes Grab bars in the bathtub or shower?: (Patient-Rptd) yes Have non-skid surface in bathtub or shower?: (Patient-Rptd) yes Good home lighting?: (Patient-Rptd) yes Regular seat belt use?: (Patient-Rptd) yes Hospital stays in the last year:: (Patient-Rptd) no  Cognitive Assessment Difficulty concentrating, remembering, or making decisions? : (Patient-Rptd) no Will 6CIT or Mini Cog be Completed: yes What year is it?: 0 points What month is it?: 0 points Give patient an address phrase to remember (5 components): [address] [place]  About what time is it?: 0 points Count backwards from 20 to 1: 0 points Say the months of the year in reverse: 0 points Repeat the address phrase from earlier: 0 points 6 CIT Score: 0 points  Advance Directives (For Healthcare) Does Patient Have a Medical Advance Directive?: Yes Does patient want to make changes to medical advance directive?: No - Patient declined Type of Advance Directive: Healthcare Power of [facility]; Living will Copy of Healthcare Power of Attorney in Chart?: No - copy requested Copy of Living Will in Chart?: No - copy requested  Reviewed/Updated  Reviewed/Updated: Reviewed All (Medical, Surgical, Family, Medications, Allergies,  Care Teams, Patient Goals)    Allergies (verified) Cipro [ciprofloxacin-ciproflox hcl er] and Morphine and codeine   Current Medications (verified) Outpatient Encounter Medications as  of 04/01/2024  Medication Sig   methylPREDNISolone  (MEDROL  DOSEPAK) 4 MG TBPK tablet Take by mouth daily, taper daily dose per package instructions.   acetaminophen  (TYLENOL ) 325 MG tablet Take 650 mg by mouth every 6 (six) hours as needed for moderate pain.   Calcium Carb-Cholecalciferol 600-400 MG-UNIT TABS Take 1 tablet by mouth daily.   denosumab  (PROLIA ) 60 MG/ML SOSY injection Inject 60 mg into the skin every 6 (six) months. Ordered through providers office. Do not send to pharmacy-ah   gabapentin  (NEURONTIN ) 300 MG capsule Take 1 capsule (300 mg total) by mouth 2 (two) times daily AND 3 capsules (900 mg total) at bedtime. For neuropathic pain.   methocarbamol  (ROBAXIN ) 500 MG tablet Take 1 tablet (500 mg total) by mouth 3 (three) times daily as needed for muscle spasms.   Omega 3-6-9 Fatty Acids (OMEGA 3-6-9 COMPLEX PO) Take 1 capsule by mouth daily.   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   Ulceration of intestine    Past Surgical History:  Procedure Laterality Date   BACK SURGERY     BIOPSY  11/23/2022   Procedure: BIOPSY;  Surgeon: Unk Corinn Skiff, MD;  Location: [facility] ENDOSCOPY;  Service: Gastroenterology;;   BREAST CYST ASPIRATION      CATARACT EXTRACTION Bilateral 06/2022   COLONOSCOPY WITH PROPOFOL  N/A 09/03/2019   Procedure: COLONOSCOPY WITH PROPOFOL ;  Surgeon: Janalyn Keene NOVAK, MD;  Location: [facility] ENDOSCOPY;  Service: Endoscopy;  Laterality: N/A;   COLONOSCOPY WITH PROPOFOL  N/A 11/23/2022   Procedure: COLONOSCOPY WITH PROPOFOL ;  Surgeon: Unk Corinn Skiff, MD;  Location: [facility] ENDOSCOPY;  Service: Gastroenterology;  Laterality: N/A;   EYE SURGERY  03.12.24   Cataract   FRACTURE SURGERY  06.20.22   Left foot   HAND SURGERY Left    JOINT REPLACEMENT     SPINAL FUSION  2015, 2016, 2017   Lumbar   TOTAL KNEE REVISION Left 05/03/2021   Procedure: TOTAL KNEE REVISION;  Surgeon: Melodi Lerner, MD;  Location: [facility];  Service: Orthopedics;  Laterality: Left;   Family History  Problem Relation Age of Onset   Breast cancer Mother 84   Cancer Mother    Arthritis Mother    Hypertension Mother    Social History   Occupational History   Not on file  Tobacco Use   Smoking status: Never   Smokeless tobacco: Never  Vaping Use   Vaping status: Never Used  Substance and Sexual Activity   Alcohol use: Yes    Comment: Occasional glass of Chardonnay or margarita   Drug use: Never   Sexual activity: Not Currently    Birth control/protection: Post-menopausal   Tobacco Counseling Counseling given: Not Answered  SDOH Screenings   Food Insecurity: No Food Insecurity (04/01/2024)  Housing: Unknown (04/01/2024)  Transportation Needs: No Transportation Needs (04/01/2024)  Utilities: Not At Risk (04/01/2024)  Alcohol Screen: Low Risk (04/01/2024)  Depression (PHQ2-9): Low Risk (04/01/2024)  Financial Resource Strain: Low Risk (04/01/2024)  Physical Activity: Sufficiently Active (04/01/2024)  Social Connections: Moderately Integrated (04/01/2024)  Recent Concern: Social Connections - Moderately Isolated (02/24/2024)  Stress: No Stress Concern Present (04/01/2024)  Tobacco Use: Low Risk (04/01/2024)  Health  Literacy: Adequate Health Literacy (04/01/2024)   See flowsheets for full screening details  Depression Screen PHQ 2 & 9 Depression Scale- Over the past 2 weeks, how often have you been bothered by any of the following problems? Little interest or pleasure in doing things: 0 Feeling down, depressed, or hopeless (PHQ Adolescent also includes...irritable): 0 PHQ-2 Total Score: 0 Trouble falling or staying asleep, or sleeping too much: 0 Feeling tired or having little energy: 0 Poor appetite or overeating (PHQ Adolescent also includes...weight loss): 0 Feeling bad about yourself - or that you are a failure or have let yourself or your family down: 0 Trouble concentrating on things, such as reading the newspaper or watching television (PHQ Adolescent also includes...like school work): 0 Moving or speaking so slowly that other people could have noticed. Or the opposite - being so fidgety or restless that you have been moving around a lot more than usual: 0 Thoughts that you would be better off dead, or of hurting yourself in some way: 0 PHQ-9 Total Score: 0     Goals Addressed             This Visit's Progress    Continue to stay healthy              Objective:    Today's Vitals   04/01/24 1338  Weight: 131 lb (59.4 kg)  Height: 5' 3.25 (1.607 m)   Body mass index is 23.02 kg/m.  Hearing/Vision screen Vision Screening - Comments:: UTD w/[facility] Immunizations and Health Maintenance Health Maintenance  Topic Date Due   Hepatitis C Screening  Never done   COVID-19 Vaccine (6 - 2025-26 season) 12/16/2023   Medicare Annual Wellness (AWV)  03/31/2024   DTaP/Tdap/Td (4 - Td or Tdap) 04/16/2025   Mammogram  12/09/2025   Colonoscopy  11/22/2032   Pneumococcal Vaccine: 50+ Years  Completed   Influenza Vaccine  Completed   Bone Density Scan  Completed   Zoster Vaccines- Shingrix  Completed   Meningococcal B Vaccine  Aged Out       Assessment/Plan:  This is a routine  wellness examination for Sidda.  Patient Care Team: Gretta Comer POUR, NP as PCP - General (Internal Medicine) Dasher, Alm LABOR, MD (Dermatology) Melodi Lerner, MD as Consulting Physician (Orthopedic Surgery) [facility], [facility]  I have personally reviewed and noted the following in the patients chart:   Medical and social history Use of alcohol, tobacco or illicit drugs  Current medications and supplements including opioid prescriptions. Functional ability and status Nutritional status Physical activity Advanced directives List of other physicians Hospitalizations, surgeries, and ER visits in previous 12 months Vitals Screenings to include cognitive, depression, and falls Referrals and appointments  No orders of the defined types were placed in this encounter.  In addition, I have reviewed and discussed with patient certain preventive protocols, quality metrics, and best practice recommendations. A written personalized care plan for preventive services as well as general preventive health recommendations were provided to patient.   Erminio LITTIE Saris, LPN   [id number]    After Visit Summary: (MyChart) Due to this being a telephonic visit, the after visit summary with patients  personalized plan was offered to patient via MyChart   Nurse Notes: No voiced or noted concerns at this time Patient advised to keep follow-up appointment with PCP (nov 2026) HM Addressed: Pt will get Covid vaccine at her pharmacy if decides

## 2024-04-02 ENCOUNTER — Ambulatory Visit

## 2024-04-02 DIAGNOSIS — M1611 Unilateral primary osteoarthritis, right hip: Secondary | ICD-10-CM

## 2024-04-02 NOTE — Progress Notes (Signed)
 Orthopaedic Surgery New Patient Visit   History of Present Illness: The patient is a 73 y.o. female seen in clinic for right hip pain.  She was referred to our office by neurosurgery secondary to complaints of right groin pain.  She does have a low back history including L1/2 decompression, prior fusion extending from L3-S1 and previous piriformis release.  Patient reports her right hip pain began about 2 to 3 months ago without inciting trauma or injury.  She localizes the pain to the groin, inner thigh and buttock region.  She does admit to some radicular symptoms into the foot as well.  She reports the right leg is on fire .  Following her piriformis release surgery in 2014, she states she continues to have right gluteal pain.  From a neurosurgery standpoint, a referral for physical therapy for her back and right hip was provided as well as a referral to the pain management team for consideration of injection and spinal cord stimulator placement.  Her gabapentin  medication was also adjusted.  She received radiographs of the right hip while being worked up by neurosurgery which suggested osteoarthritis.  She was referred to our office for further evaluation and management.  Patient reports her symptoms of right hip and thigh pain have affected her activities of daily living.  She is retired but enjoys traveling and worries that the pain will limit her from doing the things she wants to do.  In addition to gabapentin , patient has also tried oral prednisone  and nonsteroidal anti-inflammatories without relief.  She has also been doing home exercises with minimal relief.   Patient has an orthopedic history significant for left total knee arthroplasty with subsequent revision due to instability in 2023.     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   Ulceration of intestine    Past Surgical History:  Procedure Laterality Date   BACK SURGERY     BIOPSY  11/23/2022   Procedure: BIOPSY;  Surgeon: Unk Corinn Skiff, MD;  Location: [facility] ENDOSCOPY;  Service: Gastroenterology;;   BREAST CYST ASPIRATION     CATARACT EXTRACTION Bilateral 06/2022   COLONOSCOPY WITH PROPOFOL  N/A 09/03/2019   Procedure: COLONOSCOPY WITH PROPOFOL ;  Surgeon: Janalyn Keene NOVAK, MD;  Location: [facility] ENDOSCOPY;  Service: Endoscopy;  Laterality: N/A;   COLONOSCOPY WITH PROPOFOL  N/A 11/23/2022   Procedure: COLONOSCOPY WITH PROPOFOL ;  Surgeon: Unk Corinn Skiff, MD;  Location[facility] ENDOSCOPY;  Service: Gastroenterology;  Laterality: N/A;   EYE SURGERY  03.12.24   Cataract   FRACTURE SURGERY  06.20.22   Left foot   HAND SURGERY Left    JOINT REPLACEMENT     SPINAL FUSION  2015, 2016, 2017   Lumbar   TOTAL KNEE REVISION Left 05/03/2021   Procedure: TOTAL KNEE REVISION;  Surgeon: Melodi Lerner, MD;  Location: [facility];  Service: Orthopedics;  Laterality: Left;   Allergies[1] Medications Ordered Prior to Encounter[2] Social History[3]    I have reviewed past medical, surgical, social and family history, medications and allergies as documented in the EMR.  Review of  Systems - A ROS was performed including pertinent positives and negatives as documented in the HPI.     Physical Exam:  General/Constitutional: NAD Vascular: No edema, swelling or tenderness, except as noted in detailed exam Integumentary: No impressive skin lesions present, except as noted in detailed exam Neuro/Psych: Normal mood and affect, oriented to person, place and time Musculoskeletal: Normal, except as noted in detailed exam and in HPI   Focused Orthopaedic  Examination:  Hip Examination (focused):  No obvious deformity or signs of trauma noted about the right hip.    RIGHT  ROM (degrees): Extension - Flexion 15 - 120    Abduction 40    Adduction 25    IR 30    ER 50   Palpation (pain): Anterior positive   Iliopsoas positive   Greater troch negative   ASIS negative   AIIS negative   Posterior positive   Adductors positive  Special Tests: FADIR positive   FABER positive   Log Roll negative   Ober's negative   External Snapping negative   Internal Snapping negative  Other: Hip flexion strength  5/5   Hip adductor strength  5/5   Hip abductor strength  5/5    Vascular/Lymphatic: 2+ dorsalis pedis/posterior tibialis pulses,  foot warm and well perfused Neurologic: Sensation intact to light touch to Superficial peroneal/Deep peroneal/Tibial/Sural/Saphenous nerves     XR pelvis/right hip imaging: X-rays of the pelvis and right hip including AP pelvis, AP and lateral of the right hip were obtained in office on 03/29/2024 at [facility] neurosurgery and were available for my review today.  Per my interpretation, there are no fractures or dislocations.  Moderate degenerative changes noted about the right hip, most notable about the inferior aspect and weightbearing portion of the acetabulum.  Previous lumbar fusion hardware noted.  Left hip with mild to moderate degenerative changes.    Radiology Read:  IMPRESSION: 1. Mild osteoarthritis of the hip with mild-to-moderate central joint space narrowing and superior acetabular osteophytes.  Assessment:  Right hip osteoarthritis History of lumbar fusion History of right hip piriformis release  Plan:  Patient was seen and examined in office today. We reviewed patient's history, examination, and imaging in detail. Based on information available for this encounter, patient is likely symptomatic from multiple factors.  She is currently being evaluated by neurosurgery for  lumbar spine and radicular symptoms.  She does have a history of lumbar fusion which may contribute to some of her posterior and radicular complaints.  She also has clinical evidence of right hip osteoarthritis based on localized groin and medial thigh pain which is provoked with rotation of the hip.  She also has radiographic findings which are consistent with right hip osteoarthritis.  We did discuss management concerning her right hip specifically.  We discussed conservative and consideration of surgical treatment options today.  Patient notes that her symptoms have been affecting her activities of daily living.  She is an avid traveler and worries that her pain will limit her function.  At this time, patient is interested in discussing consideration of right total hip arthroplasty.  Therefore, we have placed a referral to Dr. Mariah for discussion of possible surgical management.  She will continue workup for her lumbar spine symptoms with neurosurgery as well as follow-up with pain management per their referral recommendations.   Patient education material was provided.  All questions, concerns and comments were addressed to the best of my ability.  Follow-up: Dr. Mariah with [facility]   for consideration of right total hip arthroplasty   Arlyss GEANNIE Schneider, DO Orthopedic Surgery & Sports Medicine [place]   This document was dictated using Dragon voice recognition software. A reasonable attempt at proof reading has been made to minimize errors.     [1]  Allergies Allergen Reactions   Cipro [Ciprofloxacin-Ciproflox Hcl Er] Hives   Morphine And Codeine Nausea And Vomiting  [2]  Current Outpatient Medications on File Prior to Visit  Medication Sig Dispense Refill   methylPREDNISolone  (MEDROL  DOSEPAK) 4 MG TBPK tablet Take by mouth daily, taper daily dose per package instructions. 21 tablet 0   acetaminophen  (TYLENOL ) 325 MG tablet Take 650 mg by mouth every 6 (six) hours as needed  for moderate pain.     Calcium Carb-Cholecalciferol 600-400 MG-UNIT TABS Take 1 tablet by mouth daily.     denosumab  (PROLIA ) 60 MG/ML SOSY injection Inject 60 mg into the skin every 6 (six) months. Ordered through providers office. Do not send to pharmacy-ah     gabapentin  (NEURONTIN ) 300 MG capsule Take 1 capsule (300 mg total) by mouth 2 (two) times daily AND 3 capsules (900 mg total) at bedtime. For neuropathic pain. 180 capsule 2   methocarbamol  (ROBAXIN ) 500 MG tablet Take 1 tablet (500 mg total) by mouth 3 (three) times daily as needed for muscle spasms. 30 tablet 0   Omega 3-6-9 Fatty Acids (OMEGA 3-6-9 COMPLEX PO) Take 1 capsule by mouth daily.     Current Facility-Administered Medications on File Prior to Visit  Medication Dose Route Frequency Provider Last Rate Last Admin   denosumab  (PROLIA ) injection 60 mg  60 mg Subcutaneous Q6 months Clark, Katherine K, NP       denosumab  (PROLIA ) injection 60 mg  60 mg Subcutaneous Once Clark, Katherine K, NP       [START ON 09/02/2024] denosumab  (PROLIA ) injection 60 mg  60 mg Subcutaneous Once Wendee Lynwood HERO, NP      [3]  Social History Tobacco Use   Smoking status: Never   Smokeless tobacco: Never  Vaping Use   Vaping status: Never Used  Substance Use Topics   Alcohol use: Yes    Comment: Occasional glass of Chardonnay or margarita   Drug use: Never

## 2024-04-02 NOTE — Patient Instructions (Signed)

## 2024-04-03 ENCOUNTER — Ambulatory Visit

## 2024-04-03 VITALS — BP 119/79 | HR 65 | Ht 64.0 in | Wt 130.8 lb

## 2024-04-03 DIAGNOSIS — M5416 Radiculopathy, lumbar region: Secondary | ICD-10-CM | POA: Diagnosis not present

## 2024-04-03 DIAGNOSIS — M7918 Myalgia, other site: Secondary | ICD-10-CM

## 2024-04-03 DIAGNOSIS — M1611 Unilateral primary osteoarthritis, right hip: Secondary | ICD-10-CM

## 2024-04-03 NOTE — Progress Notes (Signed)
 "  Office Visit Note   Patient: Bonnie Ramsey           Date of Birth: [birth year]           MRN: [id number] Visit Date: 04/03/2024              Requested by: Gretta Comer POUR, NP [address] [place],  [place] [address] PCP: Gretta Comer POUR, NP   Assessment & Plan: Visit Diagnoses:  1. Unilateral primary osteoarthritis, right hip   2. Radiculopathy, lumbar region   3. Piriformis muscle pain     Plan: Patient has mild osteoarthritis of the right hip visible on x ray with pain out of proportion to the radiographs. Recommend intraarticular steroid injection of the hip for diagnostic purposes and will have patient follow up in 6 weeks to discuss results of steroid injection. If injection provided relief will discuss possible hip replacement.  Follow-Up Instructions: Return in about 6 weeks (around 05/15/2024).   Orders:  Orders Placed This Encounter  Procedures   Ambulatory referral to Pain Clinic   No orders of the defined types were placed in this encounter.    Subjective: Right hip pain  HPI The patient is a 73 year old female with right hip pain. Reports that she has had pain in the groin and buttock for a long time. Pain is worse with ambulation and severe. Pain makes it difficult for her to walk and radiates down her thigh. Previously had a release of her piriformis which did not alleviate any of her pain  Objective: Vital Signs: BP 119/79 (Cuff Size: Small)   Pulse 65   Ht 5' 4 (1.626 m)   Wt 130 lb 12.8 oz (59.3 kg)   BMI 22.45 kg/m   Physical Exam Gen: Alert, No Acute Distress right hip: Skin intact. No erythema or induration. There is a well healed scar posteriorly. Full ROM. There is groin pain with internal rotation. No pain with resisted flexion  Imaging: Radiographs were personally reviewed by me, reveal mild OA of the right hip.   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, right    treated with excision    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    right Chest treated with excision    Status post revision of total knee replacement, 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   Ulceration of intestine     Family History  Problem Relation Age of Onset   Breast cancer Mother 71   Cancer Mother    Arthritis Mother    Hypertension Mother     Past Surgical History:  Procedure Laterality Date   BACK SURGERY     BIOPSY  11/23/2022   Procedure: BIOPSY;  Surgeon: Unk Corinn Skiff, MD;  Location: [facility] ENDOSCOPY;  Service: Gastroenterology;;   BREAST CYST ASPIRATION     CATARACT EXTRACTION Bilateral 06/2022   COLONOSCOPY WITH  PROPOFOL  N/A 09/03/2019   Procedure: COLONOSCOPY WITH PROPOFOL ;  Surgeon: Janalyn Keene NOVAK, MD;  Location: [facility] ENDOSCOPY;  Service: Endoscopy;  Laterality: N/A;   COLONOSCOPY WITH PROPOFOL  N/A 11/23/2022   Procedure: COLONOSCOPY WITH PROPOFOL ;  Surgeon: Unk Corinn Skiff, MD;  Location: [facility] ENDOSCOPY;  Service: Gastroenterology;  Laterality: N/A;   EYE SURGERY  03.12.24   Cataract   FRACTURE SURGERY  06.20.22   Left foot   HAND SURGERY Left    JOINT REPLACEMENT     SPINAL FUSION  2015, 2016, 2017   Lumbar   TOTAL KNEE REVISION Left 05/03/2021   Procedure: TOTAL KNEE REVISION;  Surgeon: Melodi Lerner, MD;  Location: [facility] ORS;  Service: Orthopedics;  Laterality: Left;   Social History   Occupational History   Not on file  Tobacco Use    Smoking status: Never   Smokeless tobacco: Never  Vaping Use   Vaping status: Never Used  Substance and Sexual Activity   Alcohol use: Yes    Comment: Occasional glass of Chardonnay or margarita   Drug use: Never   Sexual activity: Not Currently    Birth control/protection: Post-menopausal        "

## 2024-04-06 ENCOUNTER — Telehealth: Payer: Self-pay | Admitting: Family Medicine

## 2024-04-06 NOTE — Telephone Encounter (Signed)
 I spoke to patient and gave her the phone number to [facility] and informed her that the referral was faxed to their office. She is wanting to get an appointment as soon as possible. I also gave her the number to Dr. Charolotte office she is questioning if the 2 referrals that were placed can be done together at the same practice. She is to have a back injection and a hip injection. She has a follow up in 6 weeks and is hoping to have the injection done before. She will call our office back if she has any trouble getting scheduled.

## 2024-04-14 ENCOUNTER — Telehealth: Payer: Self-pay | Admitting: Physician Assistant

## 2024-04-14 NOTE — Telephone Encounter (Signed)
 Received call from [place]. She states that [place] with Neurosurgery and Dr. Mariah with Orthopedics referred her to Pain Management Clinic, Dr. Marcelino. Maralee Gibbs is interested in patient receiving a diagnostic/therapeutic intra-articular hip injection.  Aalayah states she has not heard from the pain management office about scheduling an appointment yet.  Called pain management clinic. No answer. Left voicemail. Stated patient was waiting for a call for an appointment after two referrals were placed. Left [facility] phone number if any questions/concerns.

## 2024-04-14 NOTE — Telephone Encounter (Signed)
 Received call from [name]. She heard from pain management clinic, has appointment Jan. 22nd for new patient eval and then possible injection scheduled in February.  She states she was hoping she could be seen sooner.  Performed Epic secure chat with [name] who stated he could see her next week at the [facility] location for evaluation and possible same day hip injection.  Called [name] back, scheduled her for Mon. Jan. 5th at 10:30am. Provided her with [facility] address.   [name] will keep appointment with pain management clinic; may wish to discuss back issues as being managed by [facility] in [place]. [name] will also start physical therapy.   Patient will call [facility] office to schedule 8 week injection follow-up.

## 2024-04-20 ENCOUNTER — Ambulatory Visit (INDEPENDENT_AMBULATORY_CARE_PROVIDER_SITE_OTHER): Admitting: Student

## 2024-04-20 ENCOUNTER — Encounter: Payer: Self-pay | Admitting: Radiology

## 2024-04-20 DIAGNOSIS — M1611 Unilateral primary osteoarthritis, right hip: Secondary | ICD-10-CM

## 2024-04-20 MED ORDER — LIDOCAINE HCL 1 % IJ SOLN
4.0000 mL | INTRAMUSCULAR | Status: AC | PRN
  Administered 2024-04-20: 4 mL

## 2024-04-20 NOTE — Progress Notes (Signed)
" ° ° ° °  HPI: Patient is a very pleasant 74 year old female who presents today for an intra-articular right hip injection.  She has been seen by our [place] office recently for symptoms and findings consistent with osteoarthritis.  She was recommended for trial of an injection before discussing if she would benefit from surgery.  She reports continued pain mainly in the anterior hip today without any other significant changes.   Physical Exam: Passive right hip range of motion is to 120 degrees flexion and 30 degrees of painful internal and external rotation.  No overlying erythema or warmth.  No pain elicited with resisted hip flexion.   Procedure Note  Patient: Bonnie Ramsey             Date of Birth: 03-Aug-1950           MRN: [id number]             Visit Date: 04/20/2024  Procedures: Visit Diagnoses:  1. Unilateral primary osteoarthritis, right hip     Large Joint Inj: R hip joint on 04/20/2024 11:16 AM Indications: pain Details: 22 G 3.5 in needle, anterior approach Medications: 4 mL lidocaine  1 % (2 mL betamethasone ) Outcome: tolerated well, no immediate complications Procedure, treatment alternatives, risks and benefits explained, specific risks discussed. Consent was given by the patient. Immediately prior to procedure a time out was called to verify the correct patient, procedure, equipment, support staff and site/side marked as required. Patient was prepped and draped in the usual sterile fashion.       Plan: Successful right hip intra-articular injection performed today and follow-up with Dr. Roscoe Stabs as previously discussed    I personally saw and evaluated the patient, and participated in the management and treatment plan.  Leonce Reveal, PA-C Orthopedics "

## 2024-05-07 ENCOUNTER — Encounter: Payer: Self-pay | Admitting: Student in an Organized Health Care Education/Training Program

## 2024-05-07 ENCOUNTER — Ambulatory Visit
Attending: Student in an Organized Health Care Education/Training Program | Admitting: Student in an Organized Health Care Education/Training Program

## 2024-05-07 VITALS — BP 139/90 | HR 53 | Temp 97.4°F | Resp 16 | Ht 64.0 in | Wt 133.0 lb

## 2024-05-07 DIAGNOSIS — G8929 Other chronic pain: Secondary | ICD-10-CM | POA: Insufficient documentation

## 2024-05-07 DIAGNOSIS — G894 Chronic pain syndrome: Secondary | ICD-10-CM | POA: Diagnosis present

## 2024-05-07 DIAGNOSIS — M48061 Spinal stenosis, lumbar region without neurogenic claudication: Secondary | ICD-10-CM | POA: Diagnosis present

## 2024-05-07 DIAGNOSIS — Z981 Arthrodesis status: Secondary | ICD-10-CM | POA: Insufficient documentation

## 2024-05-07 DIAGNOSIS — Z9889 Other specified postprocedural states: Secondary | ICD-10-CM | POA: Insufficient documentation

## 2024-05-07 DIAGNOSIS — M5416 Radiculopathy, lumbar region: Secondary | ICD-10-CM | POA: Insufficient documentation

## 2024-05-07 DIAGNOSIS — M961 Postlaminectomy syndrome, not elsewhere classified: Secondary | ICD-10-CM | POA: Insufficient documentation

## 2024-05-07 DIAGNOSIS — M51369 Other intervertebral disc degeneration, lumbar region without mention of lumbar back pain or lower extremity pain: Secondary | ICD-10-CM | POA: Insufficient documentation

## 2024-05-07 NOTE — Progress Notes (Signed)
 PROVIDER NOTE: Interpretation of information contained herein should be left to medically-trained personnel. Specific patient instructions are provided elsewhere under Patient Instructions section of medical record. This document was created in part using AI and STT-dictation technology, any transcriptional errors that may result from this process are unintentional.  Patient: Bonnie Ramsey  Service: E/M Encounter  Provider: Wallie Sherry, MD  DOB: [birth year]  Delivery: Face-to-face  Specialty: Interventional Pain Management  MRN: [id number]  Setting: Ambulatory outpatient facility  Specialty designation: 09  Type: New Patient  Location: Outpatient office facility  PCP: Bonnie Comer POUR, NP  DOS: 05/07/2024    Referring Prov.: Ulis Bottcher, PA-C   Primary Reason(s) for Visit: Encounter for initial evaluation of one or more chronic problems (new to examiner) potentially causing chronic pain, and posing a threat to normal musculoskeletal function. (Level of risk: High) CC: Back Pain (lower)  HPI  Bonnie Ramsey is a 74 y.o. year old, female patient, who comes for the first time to our practice referred by Ulis Bottcher, PA-C for our initial evaluation of Bonnie Ramsey chronic pain. She has Encounter for screening mammogram for malignant neoplasm of breast; Sleep disturbance; Chronic back pain; Osteoporosis; Chronic sinus bradycardia; Hyperlipidemia; Erythema of rectum; Encounter for screening colonoscopy; and Acute on chronic low back pain on their problem list. Today she comes in for evaluation of Bonnie Ramsey Back Pain (lower)  Pain Assessment: Location: Lower Back Radiating: down right outer aspect of right leg to toes Onset: More than a month ago Duration: Chronic pain Quality: Burning, Other (Comment) (electrical) Severity: 3 /10 (subjective, self-reported pain score)  Effect on ADL: denies Timing: Constant Modifying factors: laying on the floor with legs raised, pelvic lifts while lying on floor BP: (!)  139/90  HR: (!) 53  Onset and Duration: Date of onset: 2013 Cause of pain: Unknown Severity: No change since onset, NAS-11 at its worse: 8/10, NAS-11 at its best: 2/10, NAS-11 now: 4/10, and NAS-11 on the average: 4/10 Timing: Night, Not influenced by the time of the day, and During activity or exercise Aggravating Factors: Intercourse (sex), Motion, Prolonged sitting, and Prolonged standing Alleviating Factors: Resting, Warm showers or baths, and physical therapy Associated Problems: Night-time cramps, Numbness, Tingling, Pain that wakes patient up, and Pain that does not allow patient to sleep Quality of Pain: Burning, Constant, Deep, Nagging, Sharp, and Tingling Previous Examinations or Tests: MRI scan, Nerve conduction test, Neurological evaluation, Neurosurgical evaluation, and Orthopedic evaluation Previous Treatments: Epidural steroid injections and Physical Therapy  Bonnie Ramsey is being evaluated for possible interventional pain management therapies for the treatment of Bonnie Ramsey chronic pain.   Discussed the use of AI scribe software for clinical note transcription with the patient, who gave verbal consent to proceed.  History of Present Illness   Bonnie Ramsey is a 74 year old female with a history of extensive spine surgery who presents with chronic right buttock and leg pain.  She has experienced chronic right buttock pain radiating down Bonnie Ramsey leg since 2013. The pain is described as burning and extends to Bonnie Ramsey toes. It has persisted despite multiple surgical interventions, including an L2 to S1 fusion in 2015 and a piriformis release in 2019, neither of which alleviated Bonnie Ramsey symptoms. The pain intensity varies from 4 to 8 out of 10, depending on Bonnie Ramsey activity level, and it significantly impacts Bonnie Ramsey sleep and daily activities.  She manages Bonnie Ramsey pain with gabapentin , which provides slight relief, particularly at night to aid sleep. She has also been engaging in  physical therapy to learn how to  manage the pain rather than eliminate it. Despite these efforts, the pain remains a significant issue, particularly affecting Bonnie Ramsey ability to sleep and engage in intimate activities.  Bonnie Ramsey past medical history includes multiple spine surgeries, with the first major surgery being an L2 to S1 fusion in 2015. Subsequent surgeries were performed after the initial fusion. Despite these interventions, she continues to experience significant nerve pain and muscle soreness.  Socially, she maintains an active lifestyle, including traveling and walking up to 10,000 steps a day, despite Bonnie Ramsey chronic pain. She has a history of financial planner in [employer] and has lived in various locations, including [place]  and [place] . She currently lives alone and manages Bonnie Ramsey condition independently.  Family history reveals that Bonnie Ramsey father also suffered from back issues and lived with chronic pain until his passing at the age of 96.        Meds  Current Medications[1]  Imaging Review   MR THORACIC SPINE WO CONTRAST  Narrative EXAM: MR Thoracic Spine without 03/10/2024 09:28:39 AM  TECHNIQUE: Multiplanar multisequence MRI of the thoracic spine was performed without the administration of intravenous contrast.  COMPARISON: Lumbar MRI reported separately today.  CLINICAL HISTORY: 74 year old female with thoracic compression fracture and low back pain.  FINDINGS:  CERVICAL SPINE: Limited sagittal imaging of the cervical spine reveals degenerative spondylolisthesis at C4-C5, some straightening and reversal of the normal cervical lordosis, and multilevel chronic cervical disc and endplate degeneration. Multifactorial cervical spinal stenosis is suspected at C4-C5.  BONES AND ALIGNMENT: Thoracic segmentation appears to be normal. Mild to moderate thoracic scoliosis appears to be levoconvex in the upper and dextroconvex in the lower thoracic spine. Superimposed degenerative mild grade 1  anterolisthesis at multiple thoracic levels including from T2-T3 to T4-T5, again at T9-T10, and T11-T12. Maintained thoracic vertebral height. Normal background bone marrow signal with confluent degenerative vertebral body marrow signal changes in the mid thoracic spine especially T5 and T6 (series 18 image 9). But no convincing thoracic vertebral marrow edema.  SPINAL CORD: Maintained thoracic spinal cord volume. The conus medullaris occurs below T12-L1 and is not well evaluated on these images.  SOFT TISSUES: Unremarkable.  VISCERA: Negative visible chest and upper abdominal viscera.  DEGENERATIVE:  T1-T2: Moderate facet and ligamentum flavum hypertrophy. No spinal stenosis. Moderate to severe bilateral T1 neural foraminal stenosis.  T2-T3: Grade 1 anterolisthesis. Small central disc protrusion or pseudodisc on series 20 image 8. Severe facet hypertrophy on the right, mild to moderate on the left. Mild spinal stenosis. No spinal cord mass effect. Moderate to severe right T2 neural foraminal stenosis.  T3-T4: Mild anterolisthesis. Minor disc bulging. Moderate facet and ligamentum flavum hypertrophy. No spinal stenosis. Mild to moderate right T3 neural foraminal stenosis.  T4-T5: Grade 1 anterolisthesis. Circumferential disc bulge. Moderate posterior ligamentum flavum hypertrophy greater on the right. No spinal stenosis. Moderate to severe right T4 neural foraminal stenosis.  T5-T6: Severe disc space loss. Circumferential disc osteophyte complex asymmetric to the left. Mild to moderate facet and ligamentum flavum hypertrophy greater on the left. No significant spinal stenosis. Moderate left T5 neural foraminal stenosis.  T6-T7: Severe disc space loss. Circumferential disc osteophyte complex. Mild to moderate posterior element hypertrophy. Mild T6 neural foraminal stenosis.  T7-T8: Disc space loss with circumferential disc bulge. Moderate facet and ligamentum flavum  hypertrophy. No spinal stenosis. Moderate right T7 foraminal stenosis.  T8-T9: Circumferential disc bulge with a broad based posterior component. Moderate facet and ligamentum flavum  hypertrophy. No spinal stenosis. Moderate left and moderate to severe right T8 neural foraminal stenosis.  T9-T10: Circumferential disc bulge and endplate spurring asymmetric to the right. Severe facet and ligamentum flavum hypertrophy. Degenerative facet joint fluid on the right (series 20 image 29). Mild spinal stenosis. No spinal cord mass effect. Moderate left and severe right T9 foraminal stenosis.  T10-T11: Circumferential disc bulge. Moderate to severe facet and ligamentum flavum hypertrophy. Mild spinal stenosis. No spinal cord mass effect. Moderate to severe bilateral T10 neural foraminal stenosis.  T11-T12: Grade 1 anterolisthesis. Disc space loss with bulky circumferential disc osteophyte complex. Moderate to severe facet and ligamentum flavum hypertrophy. Moderate spinal stenosis and mild spinal cord mass effect (series 17 image 10 and series 20 image 35). No spinal cord signal abnormality. Moderate to severe bilateral T11 foraminal stenosis.  T12-L1: Disc space loss with circumferential disc osteophyte complex. Moderate posterior ligamentum flavum hypertrophy. Mild spinal stenosis and spinal cord mass effect (series 20 image 37). No cord signal abnormality. No foraminal stenosis.  IMPRESSION: 1. Advanced thoracic spine degeneration with underlying thoracic scoliosis and multilevel grade 1 anterolisthesis. Widespread bulky thoracic posterior element degeneration. No acute osseous abnormality. 2. Moderate multifactorial thoracic spinal stenosis at T11-T12 with mild spinal cord mass effect, no cord signal abnormality. Mild spinal stenosis at T2-T3, T9-T10, T10-T11, and T12-L1. 3. Widespread moderate and severe degenerative neural foraminal stenosis, worst at the bilateral T1, right T2,  right T4, right T8 , right T9, bilateral T10 and T11 nerve levels. 4. Partially visible cervical spine degeneration with spondylolisthesis and suspected cervical spinal stenosis  Electronically signed by: Helayne Hurst MD 03/17/2024 06:02 AM EST RP Workstation: [id number]   MR LUMBAR SPINE WO CONTRAST  Narrative EXAM: MRI LUMBAR SPINE 03/10/2024 09:29:05 AM  TECHNIQUE: Multiplanar multisequence MRI of the lumbar spine was performed without the administration of intravenous contrast.  COMPARISON: Thoracic MRI reported separately today. Lumbar radiographs 02/26/2024.  CLINICAL HISTORY: 74 year old female. History of lumbar fusion 2017, low back pain radiating into right buttock and hip with burning in leg and foot.  FINDINGS: Normal lumbar segmentation, concordant with the thoracic numbering today.  BONES AND ALIGNMENT: Stable lumbar lordosis from the recent radiographs. Subtle retrolisthesis of L2 on L3. Mildly exaggerated kyphosis at the thoracolumbar junction. Previous lumbar decompression and fusion. Hardware susceptibility artifact from the L3 to the S1 vertebral levels. Background bone marrow signal within normal limits. Chronic degenerative upper lumbar vertebral body and endplate marrow signal changes. Faint degenerative appearing marrow edema at L1-L2 on series 6 image 9. Intact visible sacrum and SI joints. No other marrow edema.  SPINAL CORD: The conus medullaris terminates normally at L1-L2.  SOFT TISSUES: Postoperative changes to the posterior lumbar paraspinal soft tissues with no adverse features.  DEGENERATIVE:  Lower thoracic spinal stenosis is reported separately.  L1-L2: Advanced disc space loss. Bulky but mostly anterior disc osteophyte complex. Previous laminectomy but moderate residual posterior element hypertrophy at this level (series 10 image 7). Mild spinal stenosis at the level of the conus medullaris. Mild right L1 neural foraminal  stenosis.  L2-L3: Previous decompression. Mild residual circumferential disc osteophyte complex. Asymmetric right foraminal involvement, but no significant stenosis.  L3-L4: Prior decompression and fusion. Capacious spinal canal and no adverse features identified.  L4-L5: Prior decompression and fusion. Capacious spinal canal, no adverse features.  L5-S1: Prior decompression and fusion. Capacious spinal canal. Asymmetric foraminal disc osteophyte complex greater on the left. Mild left L5 neural foraminal stenosis (series 5 image 12).  IMPRESSION: 1.  Previous lumbar decompression beginning at L1-L2, and prior posterior fusion from L3 to S1. 2. Mild multifactorial spinal stenosis at L1-L2, level of the conus medullaris. No conus mass effect or signal abnormality. 3. No other lumbar spinal stenosis. 4. Lower thoracic spine degeneration and spinal stenosis, detailed separately today.  Electronically signed by: Helayne Hurst MD 03/17/2024 06:09 AM EST RP Workstation: [id number]  DG Lumbar Spine Complete  Narrative EXAM: 4 VIEW(S) XRAY OF THE LUMBAR SPINE 02/26/2024 09:17:30 AM  COMPARISON: None available.  CLINICAL HISTORY: acute on chronic back pain, no trauma, history of prior surgery  FINDINGS:  LUMBAR SPINE: BONES: Diffuse osteopenia. Lumbosacral fusion from L3 through S1 with laminectomy changes. Mild dextrocurvature of the thoracolumbar spine centered at T12-L1. Subtle compression deformity of T12, age indeterminate. No aggressive appearing osseous lesion.  DISCS AND DEGENERATIVE CHANGES: Advanced multilevel degenerative disc disease of the thoracolumbar spine.  SOFT TISSUES: No acute abnormality.  IMPRESSION: 1. Subtle compression deformity of T12, age indeterminate. Correlate with point tenderness recommended. Non-emergent thoracic spine MRI may be beneficial to assess acuity. .  Electronically signed by: Rogelia Myers MD 02/26/2024 09:44 AM EST  RP Workstation: [id number] DG HIP UNILAT WITH PELVIS 2-3 VIEWS RIGHT  Narrative EXAM: 2 OR MORE VIEW(S) XRAY OF THE HIP 03/24/2024 11:26:38 AM  COMPARISON: None available.  CLINICAL HISTORY: hip pain  FINDINGS:  BONES AND JOINTS: No acute fracture. Mild degenerative changes of hip joint characterized by mild to moderate central joint space narrowing and osteophytosis of superior acetabulum.  SOFT TISSUES: The soft tissues are unremarkable.  LUMBAR SPINE: Surgical hardware in lower LUMBAR SPINE.  IMPRESSION: 1. Mild osteoarthritis of the hip with mild-to-moderate central joint space narrowing and superior acetabular osteophytes.  Electronically signed by: Greig Pique MD 03/29/2024 10:20 PM EST RP Workstation: [id number]   Complexity Note: Imaging results reviewed.                         ROS  Cardiovascular: No reported cardiovascular signs or symptoms such as High blood pressure, coronary artery disease, abnormal heart rate or rhythm, heart attack, blood thinner therapy or heart weakness and/or failure Pulmonary or Respiratory: No reported pulmonary signs or symptoms such as wheezing and difficulty taking a deep full breath (Asthma), difficulty blowing air out (Emphysema), coughing up mucus (Bronchitis), persistent dry cough, or temporary stoppage of breathing during sleep Neurological: Curved spine Psychological-Psychiatric: No reported psychological or psychiatric signs or symptoms such as difficulty sleeping, anxiety, depression, delusions or hallucinations (schizophrenial), mood swings (bipolar disorders) or suicidal ideations or attempts Gastrointestinal: No reported gastrointestinal signs or symptoms such as vomiting or evacuating blood, reflux, heartburn, alternating episodes of diarrhea and constipation, inflamed or scarred liver, or pancreas or irrregular and/or infrequent bowel movements Genitourinary: No reported renal or genitourinary signs or symptoms such  as difficulty voiding or producing urine, peeing blood, non-functioning kidney, kidney stones, difficulty emptying the bladder, difficulty controlling the flow of urine, or chronic kidney disease Hematological: No reported hematological signs or symptoms such as prolonged bleeding, low or poor functioning platelets, bruising or bleeding easily, hereditary bleeding problems, low energy levels due to low hemoglobin or being anemic Endocrine: No reported endocrine signs or symptoms such as high or low blood sugar, rapid heart rate due to high thyroid levels, obesity or weight gain due to slow thyroid or thyroid disease Rheumatologic: No reported rheumatological signs and symptoms such as fatigue, joint pain, tenderness, swelling, redness, heat, stiffness, decreased range of motion, with or  without associated rash Musculoskeletal: Negative for myasthenia gravis, muscular dystrophy, multiple sclerosis or malignant hyperthermia Work History: Retired  Allergies  Ms. Millea is allergic to cipro [ciprofloxacin-ciproflox hcl er] and morphine and codeine.  Laboratory Chemistry Profile   Renal Lab Results  Component Value Date   BUN 10 02/26/2024   CREATININE 0.82 02/26/2024   GFR 70.38 02/26/2024   GFRNONAA >60 05/05/2021     Electrolytes Lab Results  Component Value Date   NA 140 02/26/2024   K 4.7 02/26/2024   CL 101 02/26/2024   CALCIUM 10.2 02/26/2024     Hepatic Lab Results  Component Value Date   AST 21 02/26/2024   ALT 13 02/26/2024   ALBUMIN 4.6 02/26/2024   ALKPHOS 113 02/26/2024     ID Lab Results  Component Value Date   SARSCOV2NAA NEGATIVE 05/01/2021   STAPHAUREUS NEGATIVE 04/24/2021   MRSAPCR NEGATIVE 04/24/2021     Bone Lab Results  Component Value Date   VD25OH 29.48 (L) 10/24/2022     Endocrine Lab Results  Component Value Date   GLUCOSE 90 02/26/2024     Neuropathy No results found for: VITAMINB12, FOLATE, HGBA1C, HIV   CNS No results found  for: COLORCSF, APPEARCSF, RBCCOUNTCSF, WBCCSF, POLYSCSF, LYMPHSCSF, EOSCSF, PROTEINCSF, GLUCCSF, JCVIRUS, CSFOLI, IGGCSF, LABACHR, ACETBL   Inflammation (CRP: Acute  ESR: Chronic) No results found for: CRP, ESRSEDRATE, LATICACIDVEN   Rheumatology No results found for: RF, ANA, LABURIC, URICUR, LYMEIGGIGMAB, LYMEABIGMQN, HLAB27   Coagulation Lab Results  Component Value Date   INR 1.1 04/24/2021   LABPROT 13.7 04/24/2021   PLT 240.0 10/23/2022     Cardiovascular Lab Results  Component Value Date   HGB 13.3 10/23/2022   HCT 39.8 10/23/2022     Screening Lab Results  Component Value Date   SARSCOV2NAA NEGATIVE 05/01/2021   STAPHAUREUS NEGATIVE 04/24/2021   MRSAPCR NEGATIVE 04/24/2021     Cancer No results found for: CEA, CA125, LABCA2   Allergens No results found for: ALMOND, APPLE, ASPARAGUS, AVOCADO, BANANA, BARLEY, BASIL, BAYLEAF, GREENBEAN, LIMABEAN, WHITEBEAN, BEEFIGE, REDBEET, BLUEBERRY, BROCCOLI, CABBAGE, MELON, CARROT, CASEIN, CASHEWNUT, CAULIFLOWER, CELERY     Note: Lab results reviewed.  PFSH  Drug: Ms. Carbone  reports no history of drug use. Alcohol:  reports current alcohol use. Tobacco:  reports that she has never smoked. She has never used smokeless tobacco. 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 Family: family history includes Arthritis in Bonnie Ramsey mother; Breast cancer (age of onset: 76) in Bonnie Ramsey mother; Cancer in Bonnie Ramsey mother; Hypertension in Bonnie Ramsey mother.  Past Surgical History:  Procedure Laterality Date   BACK SURGERY     BIOPSY  11/23/2022   Procedure: BIOPSY;  Surgeon:  Unk Corinn Skiff, MD;  [facility]: [facility] ENDOSCOPY;  Service: Gastroenterology;;   BREAST CYST ASPIRATION     CATARACT EXTRACTION Bilateral 06/2022   COLONOSCOPY WITH PROPOFOL  N/A 09/03/2019   Procedure: COLONOSCOPY WITH PROPOFOL ;  Surgeon: Janalyn Keene NOVAK, MD;  Location: [facility] ENDOSCOPY;  Service: Endoscopy;  Laterality: N/A;   COLONOSCOPY WITH PROPOFOL  N/A 11/23/2022   Procedure: COLONOSCOPY WITH PROPOFOL ;  Surgeon: Unk Corinn Skiff, MD;  Location: [facility] ENDOSCOPY;  Service: Gastroenterology;  Laterality: N/A;   EYE SURGERY  03.12.24   Cataract   FRACTURE  SURGERY  06.20.22   Left foot   HAND SURGERY Left    JOINT REPLACEMENT     SPINAL FUSION  2015, 2016, 2017   Lumbar   TOTAL KNEE REVISION Left 05/03/2021   Procedure: TOTAL KNEE REVISION;  Surgeon: Melodi Lerner, MD;  Location: [facility] ORS;  Service: Orthopedics;  Laterality: Left;   Active Ambulatory Problems    Diagnosis Date Noted   Encounter for screening mammogram for malignant neoplasm of breast 07/09/2019   Sleep disturbance 07/09/2019   Chronic back pain 07/09/2019   Osteoporosis 10/21/2020   Chronic sinus bradycardia 02/17/2021   Hyperlipidemia 10/23/2022   Erythema of rectum 11/23/2022   Encounter for screening colonoscopy 11/23/2022   Acute on chronic low back pain 02/26/2024   Resolved Ambulatory Problems    Diagnosis Date Noted   Rectal bleeding 07/09/2019   Adjustment reaction with anxiety and depression 07/09/2019   Special screening for malignant neoplasms, colon    Ulceration of intestine    Pre-op testing 02/17/2021   Failed total knee arthroplasty 05/03/2021   Status post revision of total knee replacement, 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   Past Medical History:  Diagnosis Date   Arthritis    Blood in stool    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 In no  apparent acute distress Vitals:   05/07/24 0859  BP: (!) 139/90  Pulse: (!) 53  Resp: 16  Temp: (!) 97.4 F (36.3 C)  SpO2: 100%  Weight: 133 lb (60.3 kg)  Height: 5' 4 (1.626 m)   BMI Assessment: Estimated body mass index is 22.83 kg/m as calculated from the following:   Height as of this encounter: 5' 4 (1.626 m).   Weight as of this encounter: 133 lb (60.3 kg).  BMI interpretation table: BMI level Category Range association with higher incidence of chronic pain  <18 kg/m2 Underweight   18.5-24.9 kg/m2 Ideal body weight   25-29.9 kg/m2 Overweight Increased incidence by 20%  30-34.9 kg/m2 Obese (Class I) Increased incidence by 68%  35-39.9 kg/m2 Severe obesity (Class II) Increased incidence by 136%  >40 kg/m2 Extreme obesity (Class III) Increased incidence by 254%   Patient's current BMI Ideal Body weight  Body mass index is 22.83 kg/m. Ideal body weight: 54.7 kg (120 lb 9.5 oz) Adjusted ideal body weight: 57 kg (125 lb 8.9 oz)   BMI Readings from Last 4 Encounters:  05/07/24 22.83 kg/m  04/03/24 22.45 kg/m  04/01/24 23.02 kg/m  03/24/24 23.04 kg/m   Wt Readings from Last 4 Encounters:  05/07/24 133 lb (60.3 kg)  04/03/24 130 lb 12.8 oz (59.3 kg)  04/01/24 131 lb (59.4 kg)  03/24/24 131 lb 2 oz (59.5 kg)    Psych/Mental status: Alert, oriented x 3 (person, place, & time)       Eyes: PERLA Respiratory: No evidence of acute respiratory distress  Lumbar Spine Area Exam  Skin & Axial Inspection: Well healed scar from previous spine surgery detected Alignment: Scoliosis detected Functional ROM: Pain restricted ROM       Stability: No instability detected Muscle Tone/Strength: Functionally intact. No obvious neuro-muscular anomalies detected. Sensory (Neurological): Neurogenic pain pattern Palpation: No palpable anomalies       Provocative Tests: Hyperextension/rotation test: deferred today       Lumbar quadrant test (Kemp's test): deferred today  Lateral bending test: deferred today       Patrick's Maneuver: (+) for right-sided S-I arthralgia FABER* test: +) for right-sided S-I arthralgia S-I anterior distraction/compression test: +) for right-sided S-I arthralgia S-I lateral compression test: +) for right-sided S-I arthralgia S-I Thigh-thrust test: +) for right-sided S-I arthralgia S-I Gaenslen's test: +) for right-sided S-I arthralgia *(Flexion, ABduction and External Rotation) Gait & Posture Assessment  Ambulation: Unassisted Gait: Relatively normal for age and body habitus Posture: WNL  Lower Extremity Exam    Side: Right lower extremity  Side: Left lower extremity  Stability: No instability observed          Stability: No instability observed          Skin & Extremity Inspection: Skin color, temperature, and hair growth are WNL. No peripheral edema or cyanosis. No masses, redness, swelling, asymmetry, or associated skin lesions. No contractures.  Skin & Extremity Inspection: Skin color, temperature, and hair growth are WNL. No peripheral edema or cyanosis. No masses, redness, swelling, asymmetry, or associated skin lesions. No contractures.  Functional ROM: Pain restricted ROM for hip and knee joints          Functional ROM: Unrestricted ROM                  Muscle Tone/Strength: Functionally intact. No obvious neuro-muscular anomalies detected.  Muscle Tone/Strength: Functionally intact. No obvious neuro-muscular anomalies detected.  Sensory (Neurological): Neurogenic pain pattern        Sensory (Neurological): Unimpaired        DTR: Patellar: deferred today Achilles: deferred today Plantar: deferred today  DTR: Patellar: deferred today Achilles: deferred today Plantar: deferred today  Palpation: No palpable anomalies  Palpation: No palpable anomalies    Assessment  Primary Diagnosis & Pertinent Problem List: The primary encounter diagnosis was Status post lumbar spine surgery for decompression of spinal cord.  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  Visit Diagnosis (New problems to examiner): 1. Status post lumbar spine surgery for decompression of spinal cord   2. History of lumbar fusion   3. 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 Chronic radicular lumbar pain   5. Failed back surgical syndrome   6. Chronic pain syndrome    Plan of Care (Initial workup plan)  Assessment and Plan    Chronic radicular lumbar pain secondary to failed back surgical syndrome and lumbar spinal stenosis after fusion   Chronic radicular lumbar pain has persisted since 2013, primarily affecting the right buttock and radiating down the leg, with a burning sensation in the toes. Pain severity ranges from 4-5/10 to 7-8/10. Despite extensive lumbar spine surgeries, including L2 to S1 fusion and additional procedures due to failed fusion, pain continues. Gabapentin  provides some relief, especially at night. Physical therapy focuses on managing rather than eliminating pain. Differential diagnosis includes sacroiliac joint involvement and piriformis syndrome despite having a right piriformis release. Interventions around existing spinal hardware are avoided due to anatomical complexity. A caudal epidural injection will be performed to address radicular component.  If ineffective, a sacroiliac joint injection will be considered. Response to injections will be evaluated to determine further treatment options, including potential nerve ablation or stimulator.  Chronic pain syndrome   Chronic pain significantly impacts Bonnie Ramsey quality of life, causing sleep disturbances and functional  limitations. Pain management includes gabapentin , recently adjusted to 600 mg due to side effects at higher doses. She  maintains an active lifestyle, engaging in travel and physical activity despite the pain. The pain management strategy focuses on systematic evaluation and treatment to avoid overwhelming interventions. Gabapentin  at 600 mg will be continued for pain management. Continued physical activity and pain management strategies are encouraged.        Procedure Orders         Caudal Epidural Injection    Interventional management options: Ms. Brailsford was informed that there is no guarantee that she would be a candidate for interventional therapies. The decision will be based on the results of diagnostic studies, as well as Ms. Mcneice's risk profile.  Procedure(s) under consideration:  Caudal ESI Right SI joint injection Right sacral lateral branch nerve block Right cluneal nerve block Peripheral nerve stimulation Paddle trial    Provider-requested follow-up: Return in about 6 days (around 05/13/2024) for Caudal ESI, in clinic (PO Valium).  Future Appointments  Date Time Provider Department Center  05/20/2024 10:40 AM Marcelino Nurse, MD [facility] None  02/26/2025  8:40 AM Bonnie Comer POUR, NP [facility] [id number]   I discussed the assessment and treatment plan with the patient. The patient was provided an opportunity to ask questions and all were answered. The patient agreed with the plan and demonstrated an understanding of the instructions.  Patient advised to call back or seek an in-person evaluation if the symptoms or condition worsens.  I personally spent a total of 60 minutes in the care of the patient today including preparing to see the patient, getting/reviewing separately obtained history, performing a medically appropriate exam/evaluation, counseling and educating, placing orders, and documenting clinical information in the EHR.   Note by: Nurse Marcelino, MD (TTS and AI technology used. I apologize for any typographical errors that were not detected and corrected.) Date: 05/07/2024; Time:  11:31 AM     [1]  Current Outpatient Medications:    acetaminophen  (TYLENOL ) 325 MG tablet, Take 650 mg by mouth every 6 (six) hours as needed for moderate pain., Disp: , Rfl:    Calcium Carb-Cholecalciferol 600-400 MG-UNIT TABS, Take 1 tablet by mouth daily., Disp: , Rfl:    denosumab  (PROLIA ) 60 MG/ML SOSY injection, Inject 60 mg into the skin every 6 (six) months. Ordered through providers office. Do not send to pharmacy-[facility], Disp: , Rfl:    gabapentin  (NEURONTIN ) 300 MG capsule, Take 1 capsule (300 mg total) by mouth 2 (two) times daily AND 3 capsules (900 mg total) at bedtime. For neuropathic pain., Disp: 180 capsule, Rfl: 2   methylPREDNISolone  (MEDROL  DOSEPAK) 4 MG TBPK tablet, Take by mouth daily, taper daily dose per package instructions. (Patient not taking: Reported on 05/07/2024), Disp: 21 tablet, Rfl: 0   Omega 3-6-9 Fatty Acids (OMEGA 3-6-9 COMPLEX PO), Take 1 capsule by mouth daily., Disp: , Rfl:    methocarbamol  (ROBAXIN ) 500 MG tablet, Take 1 tablet (500 mg total) by mouth 3 (three) times daily as needed for muscle spasms. (Patient not taking: Reported on 05/07/2024), Disp: 30 tablet, Rfl: 0  Current Facility-Administered Medications:    denosumab  (PROLIA ) injection 60 mg, 60 mg, Subcutaneous, Q6 months, Clark, Comer POUR, NP   denosumab  (PROLIA ) injection 60 mg, 60 mg, Subcutaneous, Once, Bonnie Comer POUR, NP   NOREEN ON 09/02/2024] denosumab  (PROLIA ) injection 60 mg, 60 mg, Subcutaneous, Once, Wendee Lynwood HERO, NP

## 2024-05-07 NOTE — Patient Instructions (Signed)
Epidural Steroid Injection ?Patient Information ? ?Description: The epidural space surrounds the nerves as they exit the spinal cord.  In some patients, the nerves can be compressed and inflamed by a bulging disc or a tight spinal canal (spinal stenosis).  By injecting steroids into the epidural space, we can bring irritated nerves into direct contact with a potentially helpful medication.  These steroids act directly on the irritated nerves and can reduce swelling and inflammation which often leads to decreased pain.  Epidural steroids may be injected anywhere along the spine and from the neck to the low back depending upon the location of your pain. ?  After numbing the skin with local anesthetic (like Novocaine), a small needle is passed into the epidural space slowly.  You may experience a sensation of pressure while this is being done.  The entire block usually last less than 10 minutes. ? ?Conditions which may be treated by epidural steroids: ? ?Low back and leg pain ?Neck and arm pain ?Spinal stenosis ?Post-laminectomy syndrome ?Herpes zoster (shingles) pain ?Pain from compression fractures ? ?Preparation for the injection: ? ?Do not eat any solid food or dairy products within 8 hours of your appointment.  ?You may drink clear liquids up to 3 hours before appointment.  Clear liquids include water, black coffee, juice or soda.  No milk or cream please. ?You may take your regular medication, including pain medications, with a sip of water before your appointment  Diabetics should hold regular insulin (if taken separately) and take 1/2 normal NPH dos the morning of the procedure.  Carry some sugar containing items with you to your appointment. ?A driver must accompany you and be prepared to drive you home after your procedure.  ?Bring all your current medications with your. ?An IV may be inserted and sedation may be given at the discretion of the physician.   ?A blood pressure cuff, EKG and other monitors will  often be applied during the procedure.  Some patients may need to have extra oxygen administered for a short period. ?You will be asked to provide medical information, including your allergies, prior to the procedure.  We must know immediately if you are taking blood thinners (like Coumadin/Warfarin)  Or if you are allergic to IV iodine contrast (dye). We must know if you could possible be pregnant. ? ?Possible side-effects: ?Bleeding from needle site ?Infection (rare, may require surgery) ?Nerve injury (rare) ?Numbness & tingling (temporary) ?Difficulty urinating (rare, temporary) ?Spinal headache ( a headache worse with upright posture) ?Light -headedness (temporary) ?Pain at injection site (several days) ?Decreased blood pressure (temporary) ?Weakness in arm/leg (temporary) ?Pressure sensation in back/neck (temporary) ? ?Call if you experience: ?Fever/chills associated with headache or increased back/neck pain. ?Headache worsened by an upright position. ?New onset weakness or numbness of an extremity below the injection site ?Hives or difficulty breathing (go to the emergency room) ?Inflammation or drainage at the infection site ?Severe back/neck pain ?Any new symptoms which are concerning to you ? ?Please note: ? ?Although the local anesthetic injected can often make your back or neck feel good for several hours after the injection, the pain will likely return.  It takes 3-7 days for steroids to work in the epidural space.  You may not notice any pain relief for at least that one week. ? ?If effective, we will often do a series of three injections spaced 3-6 weeks apart to maximally decrease your pain.  After the initial series, we generally will wait several months before   considering a repeat injection of the same type. ? ?If you have any questions, please call [phone] ?[facility] ?

## 2024-05-07 NOTE — Progress Notes (Signed)
 Safety precautions to be maintained throughout the outpatient stay will include: orient to surroundings, keep bed in low position, maintain call bell within reach at all times, provide assistance with transfer out of bed and ambulation.

## 2024-05-20 ENCOUNTER — Encounter: Payer: Self-pay | Admitting: Student in an Organized Health Care Education/Training Program

## 2024-05-20 ENCOUNTER — Ambulatory Visit
Admission: RE | Admit: 2024-05-20 | Discharge: 2024-05-20 | Disposition: A | Source: Ambulatory Visit | Attending: Student in an Organized Health Care Education/Training Program | Admitting: Student in an Organized Health Care Education/Training Program

## 2024-05-20 ENCOUNTER — Ambulatory Visit: Admitting: Student in an Organized Health Care Education/Training Program

## 2024-05-20 VITALS — BP 142/108 | HR 57 | Temp 97.2°F | Resp 16 | Ht 64.0 in | Wt 129.0 lb

## 2024-05-20 DIAGNOSIS — Z981 Arthrodesis status: Secondary | ICD-10-CM

## 2024-05-20 DIAGNOSIS — Z9889 Other specified postprocedural states: Secondary | ICD-10-CM

## 2024-05-20 DIAGNOSIS — G894 Chronic pain syndrome: Secondary | ICD-10-CM

## 2024-05-20 DIAGNOSIS — G8929 Other chronic pain: Secondary | ICD-10-CM

## 2024-05-20 MED ORDER — LIDOCAINE HCL 2 % IJ SOLN
20.0000 mL | Freq: Once | INTRAMUSCULAR | Status: AC
  Administered 2024-05-20: 400 mg
  Filled 2024-05-20: qty 20

## 2024-05-20 MED ORDER — IOHEXOL 180 MG/ML  SOLN
10.0000 mL | Freq: Once | INTRAMUSCULAR | Status: AC
  Administered 2024-05-20: 10 mL via EPIDURAL
  Filled 2024-05-20: qty 20

## 2024-05-20 MED ORDER — DEXAMETHASONE SOD PHOSPHATE PF 10 MG/ML IJ SOLN
10.0000 mg | Freq: Once | INTRAMUSCULAR | Status: AC
  Administered 2024-05-20: 10 mg
  Filled 2024-05-20: qty 1

## 2024-05-20 MED ORDER — ROPIVACAINE HCL 2 MG/ML IJ SOLN
2.0000 mL | Freq: Once | INTRAMUSCULAR | Status: AC
  Administered 2024-05-20: 2 mL via EPIDURAL
  Filled 2024-05-20: qty 20

## 2024-05-20 MED ORDER — DIAZEPAM 5 MG PO TABS
ORAL_TABLET | ORAL | Status: AC
  Filled 2024-05-20: qty 1

## 2024-05-20 MED ORDER — SODIUM CHLORIDE 0.9% FLUSH
2.0000 mL | Freq: Once | INTRAVENOUS | Status: AC
  Administered 2024-05-20: 2 mL

## 2024-05-20 NOTE — Progress Notes (Signed)
 PROVIDER NOTE: Interpretation of information contained herein should be left to medically-trained personnel. Specific patient instructions are provided elsewhere under Patient Instructions section of medical record. This document was created in part using STT-dictation technology, any transcriptional errors that may result from this process are unintentional.  Patient: Bonnie Ramsey Type: Established DOB: [birth year] MRN: [id number] PCP: Gretta Comer POUR, NP  Service: Procedure DOS: 05/20/2024 Setting: Ambulatory Location: Ambulatory outpatient facility Delivery: Face-to-face Provider: Wallie Sherry, MD Specialty: Interventional Pain Management Specialty designation: 09 Location: Outpatient facility Ref. Prov.: Gretta Comer POUR, NP       Interventional Therapy   Type: Caudal Epidural Steroid Injection #1 Laterality: Midline aiming right Level: Sacrococcygeal ligament  Imaging: Fluoroscopy-guided         Anesthesia: Local anesthesia (1-2% Lidocaine ) Anxiolysis: Valium  5 mg DOS: 05/20/2024  Performed by: Wallie Sherry, MD  Purpose: Diagnostic/Therapeutic Indications: Low back and lower extremity pain severe enough to impact quality of life or function. Rationale (medical necessity): procedure needed and proper for the diagnosis and/or treatment of Bonnie Ramsey's medical symptoms and needs. 1. Chronic radicular lumbar pain   2. Status post lumbar spine surgery for decompression of spinal cord   3. History of lumbar fusion   4. Chronic pain syndrome    NAS-11 Pain score:   Pre-procedure: 7 /10   Post-procedure: 7 /10     Target: Lumbosacral epidural canal  Location: Epidural space  Region: Caudal canal Approach: Percutaneous  Type of procedure: Epidural block  Position  Prep  Materials:  Position: Prone  Prep solution: ChloraPrep (2% chlorhexidine  gluconate and 70% isopropyl alcohol) Prep Area: Entire posterior lumbosacral area  Materials:  Tray: Epidural Needle(s):   Type: Epidural  Gauge (G): 22y  Length: 3.5-in  Qty: 1  H&P (Pre-op Assessment):  Bonnie Ramsey is a 74 y.o. (year old), female patient, seen today for interventional treatment. 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 Bonnie Ramsey has a current medication list which includes the following prescription(s): acetaminophen , calcium carb-cholecalciferol, denosumab , gabapentin , methylprednisolone , omega 3-6-9 fatty acids, and methocarbamol , and the following Facility-Administered Medications: denosumab , denosumab , and [START ON 09/02/2024] denosumab . Her primarily concern today is the Back Pain (lower)  Initial Vital Signs:  Pulse/HCG Rate: (!) 57ECG Heart Rate: 71 Temp: (!) 97.2 F (36.2 C) Resp: 16 BP: (!) 136/91 SpO2: 100 %  BMI: Estimated body mass index is 22.14 kg/m as calculated from the following:   Height as of this encounter: 5' 4 (1.626 m).   Weight as of this encounter: 129 lb (58.5 kg).  Risk Assessment: Allergies: Reviewed. She is allergic to cipro [ciprofloxacin-ciproflox hcl er] and morphine and codeine.  Allergy Precautions: None required Coagulopathies: Reviewed. None identified.  Blood-thinner therapy: None at this time Active Infection(s): Reviewed. None identified. Bonnie Ramsey is afebrile  Site Confirmation: Bonnie Ramsey was asked to confirm the procedure and laterality before marking the site Procedure checklist: Completed Consent: Before the procedure and under the influence of no sedative(s), amnesic(s), or anxiolytics, the patient was informed of the treatment options, risks and possible complications. To  fulfill our ethical and legal obligations, as recommended by the American Medical Association's Code of Ethics, I have informed the patient of my clinical impression; the nature and purpose of  the treatment or procedure; the risks, benefits, and possible complications of the intervention; the alternatives, including doing nothing; the risk(s) and benefit(s) of the alternative treatment(s) or procedure(s); and the risk(s) and benefit(s) of doing nothing. The patient was provided information about the general risks and possible complications associated with the procedure. These may include, but are not limited to: failure to achieve desired goals, infection, bleeding, organ or nerve damage, allergic reactions, paralysis, and death. In addition, the patient was informed of those risks and complications associated to Spine-related procedures, such as failure to decrease pain; infection (i.e.: Meningitis, epidural or intraspinal abscess); bleeding (i.e.: epidural hematoma, subarachnoid hemorrhage, or any other type of intraspinal or peri-dural bleeding); organ or nerve damage (i.e.: Any type of peripheral nerve, nerve root, or spinal cord injury) with subsequent damage to sensory, motor, and/or autonomic systems, resulting in permanent pain, numbness, and/or weakness of one or several areas of the body; allergic reactions; (i.e.: anaphylactic reaction); and/or death. Furthermore, the patient was informed of those risks and complications associated with the medications. These include, but are not limited to: allergic reactions (i.e.: anaphylactic or anaphylactoid reaction(s)); adrenal axis suppression; blood sugar elevation that in diabetics may result in ketoacidosis or comma; water  retention that in patients with history of congestive heart failure may result in shortness of breath, pulmonary edema, and decompensation with resultant heart failure; weight gain; swelling or edema; medication-induced neural toxicity;  particulate matter embolism and blood vessel occlusion with resultant organ, and/or nervous system infarction; and/or aseptic necrosis of one or more joints. Finally, the patient was informed that Medicine is not an exact science; therefore, there is also the possibility of unforeseen or unpredictable risks and/or possible complications that may result in a catastrophic outcome. The patient indicated having understood very clearly. We have given the patient no guarantees and we have made no promises. Enough time was given to the patient to ask questions, all of which were answered to the patient's satisfaction. Ms. Gilbertson has indicated that she wanted to continue with the procedure. Attestation: I, the ordering provider, attest that I have discussed with the patient the benefits, risks, side-effects, alternatives, likelihood of achieving goals, and potential problems during recovery for the procedure that I have provided informed consent. Date  Time: 05/20/2024 10:30 AM  Imaging Guidance (Spinal):          Type of Imaging Technique: Fluoroscopy Guidance (Spinal) Indication(s): Fluoroscopy guidance for needle placement to enhance accuracy in procedures requiring precise needle localization for targeted delivery of medication in or near specific anatomical locations not easily accessible without such real-time imaging assistance. Exposure Time: Please see nurses notes. Contrast: Before injecting any contrast, we confirmed that the patient did not have an allergy to iodine , shellfish, or radiological contrast. Once satisfactory needle placement was completed at the desired level, radiological contrast was injected. Contrast injected under live fluoroscopy. No contrast complications. See chart for type and volume of contrast used. Fluoroscopic Guidance: I was personally present during the use of fluoroscopy. Tunnel Vision Technique used to obtain the best possible view of the target area. Parallax error  corrected before commencing the procedure. Direction-depth-direction technique used to introduce the needle under continuous pulsed fluoroscopy. Once target was reached, antero-posterior, oblique, and lateral fluoroscopic projection used confirm needle placement in all planes. Images permanently stored in EMR. Interpretation: I personally interpreted the imaging intraoperatively. Adequate needle placement confirmed in multiple planes. Appropriate spread of contrast into desired area was observed. No evidence of afferent or efferent intravascular uptake.  No intrathecal or subarachnoid spread observed. Permanent images saved into the patient's record.  Pre-Procedure Preparation:  Monitoring: As per clinic protocol. Respiration, ETCO2, SpO2, BP, heart rate and rhythm monitor placed and checked for adequate function Safety Precautions: Patient was assessed for positional comfort and pressure points before starting the procedure. Time-out: I initiated and conducted the Time-out before starting the procedure, as per protocol. The patient was asked to participate by confirming the accuracy of the Time Out information. Verification of the correct person, site, and procedure were performed and confirmed by me, the nursing staff, and the patient. Time-out conducted as per Joint Commission's Universal Protocol (UP.01.01.01). Time: 1102 Start Time: 1102 hrs.  Description  Narrative of Procedure:          Start Time: 1102 hrs.  Technical description of procedure:  Safety Precautions: Aspiration looking for blood return was conducted prior to all injections. At no point did we inject any substances, as a needle was being advanced. No attempts were made at seeking any paresthesias. Safe injection practices and needle disposal techniques used. Medications properly checked for expiration dates. SDV (single dose vial) medications used. Description of the Procedure: Protocol guidelines were followed. The  patient was placed in position over the fluoroscopy table. The target area was identified and the area prepped in the usual manner. Skin & deeper tissues infiltrated with local anesthetic. Appropriate amount of time allowed to pass for local anesthetics to take effect. The procedure needle was then advanced to the target area. Proper needle placement secured. Negative aspiration confirmed. olution injected in intermittent fashion, asking for systemic symptoms every 0.5cc of injectate. The needle/catheter were removed and the area cleansed, making sure to leave some of the prepping solution back to take advantage of its long term bactericidal properties.  5 cc solution made of 2 cc of preservative-free saline, 2 cc of 0.2% ropivacaine , 1 cc of Decadron  10 mg/cc.   Vitals:   05/20/24 1034 05/20/24 1100  BP: (!) 136/91 (!) 142/108  Pulse: (!) 57   Resp: 16 16  Temp: (!) 97.2 F (36.2 C)   TempSrc: Temporal   SpO2:  100%  Weight: 129 lb (58.5 kg)   Height: 5' 4 (1.626 m)      End Time: 1105 hrs.  Post-operative Assessment:  Post-procedure Vital Signs:  Pulse/HCG Rate: (!) 5771 Temp: (!) 97.2 F (36.2 C) Resp: 16 BP: (!) 142/108 SpO2: 100 %  EBL: None  Complications: No immediate post-treatment complications observed by team, or reported by patient.  Note: The patient tolerated the entire procedure well. A repeat set of vitals were taken after the procedure and the patient was kept under observation following institutional policy, for this type of procedure. Post-procedural neurological assessment was performed, showing return to baseline, prior to discharge. The patient was provided with post-procedure discharge instructions, including a section on how to identify potential problems. Should any problems arise concerning this procedure, the patient was given instructions to immediately contact us , at any time, without hesitation. In any case, we plan to contact the patient by telephone  for a follow-up status report regarding this interventional procedure.  Comments:  No additional relevant information.  Plan of Care (POC)  Orders:  Orders Placed This Encounter  Procedures   DG PAIN [facility] C-ARM 1-60 MIN NO REPORT    Intraoperative interpretation by procedural physician at [facility].    Standing Status:   Standing    Number of Occurrences:   1  Reason for exam::   Assistance in needle guidance and placement for procedures requiring needle placement in or near specific anatomical locations not easily accessible without such assistance.     Medications ordered for procedure: Meds ordered this encounter  Medications   iohexol  (OMNIPAQUE ) 180 MG/ML injection 10 mL    Must be Myelogram-compatible. If not available, you may substitute with a water -soluble, non-ionic, hypoallergenic, myelogram-compatible radiological contrast medium.   lidocaine  (XYLOCAINE ) 2 % (with pres) injection 400 mg   ropivacaine  (PF) 2 mg/mL (0.2%) (NAROPIN ) injection 2 mL   sodium chloride  flush (NS) 0.9 % injection 2 mL   dexamethasone  (DECADRON ) injection 10 mg   Medications administered: We administered iohexol , lidocaine , ropivacaine  (PF) 2 mg/mL (0.2%), sodium chloride  flush, and dexamethasone .  See the medical record for exact dosing, route, and time of administration.    Right caudal ESI 05/20/2024    Follow-up plan:   Return in about 3 weeks (around 06/10/2024) for PPE, F2F, BL.     Recent Visits Date Type Provider Dept  05/07/24 Office Visit Marcelino Nurse, MD [facility]  Showing recent visits within past 90 days and meeting all other requirements Today's Visits Date Type Provider Dept  05/20/24 Procedure visit Marcelino Nurse, MD [facility]  Showing today's visits and meeting all other requirements Future Appointments Date Type Provider Dept  06/16/24 Appointment Marcelino Nurse, MD [facility]  Showing future appointments within next  90 days and meeting all other requirements   Disposition: Discharge home  Discharge (Date  Time): 05/20/2024; 1110 hrs.   Primary Care Physician: Gretta Comer POUR, NP Location: [facility] Note by: Nurse Marcelino, MD (TTS technology used. I apologize for any typographical errors that were not detected and corrected.) Date: 05/20/2024; Time: 12:09 PM  Disclaimer:  Medicine is not an visual merchandiser. The only guarantee in medicine is that nothing is guaranteed. It is important to note that the decision to proceed with this intervention was based on the information collected from the patient. The Data and conclusions were drawn from the patient's questionnaire, the interview, and the physical examination. Because the information was provided in large part by the patient, it cannot be guaranteed that it has not been purposely or unconsciously manipulated. Every effort has been made to obtain as much relevant data as possible for this evaluation. It is important to note that the conclusions that lead to this procedure are derived in large part from the available data. Always take into account that the treatment will also be dependent on availability of resources and existing treatment guidelines, considered by other Pain Management Practitioners as being common knowledge and practice, at the time of the intervention. For Medico-Legal purposes, it is also important to point out that variation in procedural techniques and pharmacological choices are the acceptable norm. The indications, contraindications, technique, and results of the above procedure should only be interpreted and judged by a Board-Certified Interventional Pain Specialist with extensive familiarity and expertise in the same exact procedure and technique.

## 2024-05-20 NOTE — Patient Instructions (Signed)

## 2024-05-20 NOTE — Progress Notes (Signed)
 Safety precautions to be maintained throughout the outpatient stay will include: orient to surroundings, keep bed in low position, maintain call bell within reach at all times, provide assistance with transfer out of bed and ambulation.

## 2024-05-21 ENCOUNTER — Telehealth: Payer: Self-pay | Admitting: *Deleted

## 2024-05-21 NOTE — Telephone Encounter (Signed)
 No problems post procedure.

## 2024-06-16 ENCOUNTER — Ambulatory Visit: Admitting: Student in an Organized Health Care Education/Training Program

## 2024-06-23 ENCOUNTER — Ambulatory Visit

## 2025-02-26 ENCOUNTER — Encounter: Admitting: Primary Care
